# Patient Record
Sex: Female | Born: 1975 | Race: White | Hispanic: No | Marital: Married
Health system: Southern US, Community
[De-identification: ages and names within clinical notes are randomized; demographics above are authoritative.]

## PROBLEM LIST (undated history)

## (undated) DIAGNOSIS — Z803 Family history of malignant neoplasm of breast: Secondary | ICD-10-CM

## (undated) DIAGNOSIS — K219 Gastro-esophageal reflux disease without esophagitis: Secondary | ICD-10-CM

## (undated) DIAGNOSIS — I499 Cardiac arrhythmia, unspecified: Secondary | ICD-10-CM

## (undated) DIAGNOSIS — J45909 Unspecified asthma, uncomplicated: Secondary | ICD-10-CM

## (undated) DIAGNOSIS — F419 Anxiety disorder, unspecified: Secondary | ICD-10-CM

## (undated) DIAGNOSIS — Z8659 Personal history of other mental and behavioral disorders: Secondary | ICD-10-CM

## (undated) DIAGNOSIS — Z9889 Other specified postprocedural states: Secondary | ICD-10-CM

## (undated) DIAGNOSIS — Z8041 Family history of malignant neoplasm of ovary: Secondary | ICD-10-CM

## (undated) DIAGNOSIS — E119 Type 2 diabetes mellitus without complications: Secondary | ICD-10-CM

## (undated) DIAGNOSIS — Z808 Family history of malignant neoplasm of other organs or systems: Secondary | ICD-10-CM

## (undated) DIAGNOSIS — Z87898 Personal history of other specified conditions: Secondary | ICD-10-CM

## (undated) HISTORY — DX: Family history of malignant neoplasm of other organs or systems: Z80.8

## (undated) HISTORY — DX: Personal history of other mental and behavioral disorders: Z86.59

## (undated) HISTORY — PX: WISDOM TOOTH EXTRACTION: SHX21

## (undated) HISTORY — DX: Family history of malignant neoplasm of ovary: Z80.41

## (undated) HISTORY — DX: Personal history of other specified conditions: Z87.898

## (undated) HISTORY — PX: OTHER SURGICAL HISTORY: SHX169

## (undated) HISTORY — DX: Family history of malignant neoplasm of breast: Z80.3

---
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
## 2017-03-13 ENCOUNTER — Encounter: Payer: Self-pay | Admitting: Cardiology

## 2017-03-13 NOTE — Progress Notes (Signed)
Cardiology Office Note  Date: 03/14/2017   ID: Jennifer Kane, DOB 1975/11/10, MRN [id number]  PCP: Rory Percy, MD  Consulting Cardiologist: Rozann Lesches, MD   Chief Complaint  Patient presents with  . Intermittent lightheadedness    History of Present Illness: Jennifer Kane is a 41 y.o. female referred for cardiology consultation by Dr. Nadara Mustard for evaluation of orthostatic hypotension.  She reports episodes of feeling weak and lightheaded intermittently, particularly when she bends over and stands back up.  This has not necessarily been associated with palpitations, shortness of breath, or chest pain.  He has not had any frank syncope.  Details are not clear but she was diagnosed with tachycardia at age 48, reportedly had an abnormal tilt table test at [facility] and was placed on beta-blocker which she has been on ever since.  She was noted to be orthostatic at recent check with PCP and taken off atenolol completely.  She was only taking a half of a 12.5 mg dose.  She exercises with [employer], generally feels well but does not overly push herself.  States that she hydrates well, drinks water throughout the day.  States that she has a history of panic attacks, but none in many years.  Orthostatics were checked in the office today.  Supine blood pressure 112/78 with heart rate 62, seated blood pressure 106/74 with heart rate 67, standing blood pressure 122/80 with heart rate 75, and standing blood pressure after 3 minutes 118/78 with heart rate 72.  Past Medical History:  Diagnosis Date  . History of panic attacks   . History of tachycardia    Details not clear, reported abnormal tilt table test at age 38, on chronic low-dose beta-blocker    Past Surgical History:  Procedure Laterality Date  . No previous surgeries      Current Outpatient Prescriptions  Medication Sig Dispense Refill  . levonorgestrel-ethinyl estradiol (PORTIA-28) 0.15-30 MG-MCG tablet Take 1 tablet by mouth  daily.      No current facility-administered medications for this visit.    Allergies:  Patient has no known allergies.   Social History: The patient  reports that she has never smoked. She has never used smokeless tobacco. She reports that she does not drink alcohol or use drugs.   Family History: The patient's family history includes CAD in her father.   ROS:  Please see the history of present illness. Otherwise, complete review of systems is positive for none.  All other systems are reviewed and negative.   Physical Exam: VS:  BP 114/80   Pulse 84   Ht 5\' 3"  (1.6 m)   Wt 210 lb (95.3 kg)   SpO2 98%   BMI 37.20 kg/m , BMI Body mass index is 37.2 kg/m.  Wt Readings from Last 3 Encounters:  03/14/17 210 lb (95.3 kg)    General: Obese woman, appears comfortable at rest. HEENT: Conjunctiva and lids normal, oropharynx clear. Neck: Supple, no elevated JVP or carotid bruits, no thyromegaly. Lungs: Clear to auscultation, nonlabored breathing at rest. Cardiac: Regular rate and rhythm, no S3 or significant systolic murmur, no pericardial rub. Abdomen: Soft, nontender, bowel sounds present, no guarding or rebound. Extremities: No pitting edema, distal pulses 2+. Skin: Warm and dry. Musculoskeletal: No kyphosis. Neuropsychiatric: Alert and oriented x3, affect grossly appropriate.  ECG: I personally reviewed the tracing from 02/08/2017 which showed sinus bradycardia with low voltage.  Recent Labwork:  April 2018: BUN 14, creatinine 0.84, potassium 4.6, AST 16, ALT 18, hemoglobin  13.5, cholesterol 167, triglycerides 165, HDL 35, LDL 99, hemoglobin A1c 5.8, TSH 2.65  Assessment and Plan:  1.  Intermittent orthostatic lightheadedness, no frank syncope or definitive palpitations.  Her history suggests potential orthostatic intolerance in the past with reported abnormal tilt table test.  Current orthostatic measurements are normal and argue against POTS syndrome as well.  For now would  stay off atenolol.  We will obtain an echocardiogram to ensure normal cardiac structure and function, and also a 7-day event monitor to exclude any potential arrhythmias in association with symptoms.  Would otherwise continue to exercise and maintain an adequate hydration status.  2.  History of panic attacks, none reported in several years.  Current medicines were reviewed with the patient today.   Orders Placed This Encounter  Procedures  . Cardiac event monitor  . ECHOCARDIOGRAM COMPLETE    Disposition: Call with test results.  Signed, Satira Sark, MD, [facility] 03/14/2017 3:38 PM    [facility] [address], [place],  [address] Phone: [phone]; Fax: [phone]

## 2017-03-14 ENCOUNTER — Ambulatory Visit (INDEPENDENT_AMBULATORY_CARE_PROVIDER_SITE_OTHER): Payer: Commercial Managed Care - PPO | Admitting: Cardiology

## 2017-03-14 ENCOUNTER — Encounter: Payer: Self-pay | Admitting: Cardiology

## 2017-03-14 VITALS — BP 114/80 | HR 84 | Ht 63.0 in | Wt 210.0 lb

## 2017-03-14 DIAGNOSIS — R55 Syncope and collapse: Secondary | ICD-10-CM | POA: Diagnosis not present

## 2017-03-14 DIAGNOSIS — R42 Dizziness and giddiness: Secondary | ICD-10-CM | POA: Diagnosis not present

## 2017-03-14 DIAGNOSIS — Z8659 Personal history of other mental and behavioral disorders: Secondary | ICD-10-CM

## 2017-03-14 DIAGNOSIS — I951 Orthostatic hypotension: Secondary | ICD-10-CM | POA: Diagnosis not present

## 2017-03-14 NOTE — Patient Instructions (Signed)
Medication Instructions:  Your physician recommends that you continue on your current medications as directed. Please refer to the Current Medication list given to you today.  Labwork: NONE  Testing/Procedures: Your physician has requested that you have an echocardiogram. Echocardiography is a painless test that uses sound waves to create images of your heart. It provides your doctor with information about the size and shape of your heart and how well your heart's chambers and valves are working. This procedure takes approximately one hour. There are no restrictions for this procedure.  Your physician has recommended that you wear an event monitor FOR 7 DAYS. Event monitors are medical devices that record the heart's electrical activity. Doctors most often [place] these monitors to diagnose arrhythmias. Arrhythmias are problems with the speed or rhythm of the heartbeat. The monitor is a small, portable device. You can wear one while you do your normal daily activities. This is usually used to diagnose what is causing palpitations/syncope (passing out).   Follow-Up: Your physician recommends that you schedule a follow-up appointment PENDING TEST RESULTS  Any Other Special Instructions Will Be Listed Below (If Applicable).  If you need a refill on your cardiac medications before your next appointment, please call your pharmacy.

## 2017-03-21 ENCOUNTER — Encounter (INDEPENDENT_AMBULATORY_CARE_PROVIDER_SITE_OTHER): Payer: Commercial Managed Care - PPO

## 2017-03-21 ENCOUNTER — Telehealth: Payer: Self-pay

## 2017-03-21 ENCOUNTER — Ambulatory Visit (INDEPENDENT_AMBULATORY_CARE_PROVIDER_SITE_OTHER): Payer: Commercial Managed Care - PPO

## 2017-03-21 ENCOUNTER — Other Ambulatory Visit: Payer: Self-pay

## 2017-03-21 DIAGNOSIS — R42 Dizziness and giddiness: Secondary | ICD-10-CM

## 2017-03-21 DIAGNOSIS — R55 Syncope and collapse: Secondary | ICD-10-CM

## 2017-03-21 NOTE — Telephone Encounter (Signed)
-----   Message from Satira Sark, MD sent at 03/21/2017 12:59 PM EST ----- Results reviewed.  Normal LVEF at 60-65% and no major valvular abnormalities.  Overall reassuring.  We will follow-up on cardiac monitor. A copy of this test should be forwarded to Rory Percy, MD.

## 2017-03-21 NOTE — Telephone Encounter (Signed)
Patient notified. Routed to PCP 

## 2017-04-01 ENCOUNTER — Telehealth: Payer: Self-pay

## 2017-04-01 NOTE — Telephone Encounter (Signed)
LMTCB

## 2017-04-01 NOTE — Telephone Encounter (Signed)
-----   Message from Acquanetta Chain, LPN sent at 04/88/8916 10:30 AM EST -----   ----- Message ----- From: Satira Sark, MD Sent: 04/01/2017  10:25 AM To: Merlene Laughter, LPN  Results reviewed.  Reassuring results, no arrhythmia noted. A copy of this test should be forwarded to Rory Percy, MD.

## 2017-04-02 NOTE — Telephone Encounter (Signed)
LMTCB

## 2017-04-02 NOTE — Telephone Encounter (Signed)
Patient notified. Routed to PCP 

## 2018-03-20 DIAGNOSIS — Z6833 Body mass index (BMI) 33.0-33.9, adult: Secondary | ICD-10-CM | POA: Diagnosis not present

## 2018-03-20 DIAGNOSIS — R1013 Epigastric pain: Secondary | ICD-10-CM | POA: Diagnosis not present

## 2018-03-25 DIAGNOSIS — K8020 Calculus of gallbladder without cholecystitis without obstruction: Secondary | ICD-10-CM | POA: Diagnosis not present

## 2018-03-25 DIAGNOSIS — R160 Hepatomegaly, not elsewhere classified: Secondary | ICD-10-CM | POA: Diagnosis not present

## 2018-03-25 DIAGNOSIS — R1013 Epigastric pain: Secondary | ICD-10-CM | POA: Diagnosis not present

## 2018-04-14 DIAGNOSIS — K8010 Calculus of gallbladder with chronic cholecystitis without obstruction: Secondary | ICD-10-CM | POA: Diagnosis not present

## 2018-04-15 ENCOUNTER — Other Ambulatory Visit: Payer: Self-pay | Admitting: Family Medicine

## 2018-04-15 DIAGNOSIS — R160 Hepatomegaly, not elsewhere classified: Secondary | ICD-10-CM

## 2018-04-25 NOTE — Progress Notes (Signed)
03-21-17 (Epic) ECHO

## 2018-04-25 NOTE — Patient Instructions (Addendum)
Jennifer Kane  04/25/2018   Your procedure is scheduled on: 05-01-18    Report to [facility] Main  Entrance    Report to Admitting at 8:00 AM    Call this number if you have problems the morning of surgery [phone]    Remember: Do not eat food or drink liquids :After Midnight.    BRUSH YOUR TEETH MORNING OF SURGERY AND RINSE YOUR MOUTH OUT, NO CHEWING GUM CANDY OR MINTS.     Take these medicines the morning of surgery with A SIP OF WATER: None                                You may not have any metal on your body including hair pins and              piercings  Do not wear jewelry, make-up, lotions, powders or perfumes, deodorant             Do not wear nail polish.  Do not shave  48 hours prior to surgery.     Do not bring valuables to the hospital. [place].  Contacts, dentures or bridgework may not be worn into surgery.      Patients discharged the day of surgery will not be allowed to drive home.  Name and phone number of your driver: Georgiann Neider [phone]  Special Instructions: N/A              Please read over the following fact sheets you were given: _____________________________________________________________________             [facility] - Preparing for Surgery Before surgery, you can play an important role.  Because skin is not sterile, your skin needs to be as free of germs as possible.  You can reduce the number of germs on your skin by washing with CHG (chlorahexidine gluconate) soap before surgery.  CHG is an antiseptic cleaner which kills germs and bonds with the skin to continue killing germs even after washing. Please DO NOT use if you have an allergy to CHG or antibacterial soaps.  If your skin becomes reddened/irritated stop using the CHG and inform your nurse when you arrive at Short Stay. Do not shave (including legs and underarms) for at least 48 hours prior to  the first CHG shower.  You may shave your face/neck. Please follow these instructions carefully:  1.  Shower with CHG Soap the night before surgery and the  morning of Surgery.  2.  If you choose to wash your hair, wash your hair first as usual with your  normal  shampoo.  3.  After you shampoo, rinse your hair and body thoroughly to remove the  shampoo.                           4.  Use CHG as you would any other liquid soap.  You can apply chg directly  to the skin and wash                       Gently with a scrungie or clean washcloth.  5.  Apply the CHG Soap to your body  ONLY FROM THE NECK DOWN.   Do not use on face/ open                           Wound or open sores. Avoid contact with eyes, ears mouth and genitals (private parts).                       Wash face,  Genitals (private parts) with your normal soap.             6.  Wash thoroughly, paying special attention to the area where your surgery  will be performed.  7.  Thoroughly rinse your body with warm water from the neck down.  8.  DO NOT shower/wash with your normal soap after using and rinsing off  the CHG Soap.                9.  Pat yourself dry with a clean towel.            10.  Wear clean pajamas.            11.  Place clean sheets on your bed the night of your first shower and do not  sleep with pets. Day of Surgery : Do not apply any lotions/deodorants the morning of surgery.  Please wear clean clothes to the hospital/surgery center.  FAILURE TO FOLLOW THESE INSTRUCTIONS MAY RESULT IN THE CANCELLATION OF YOUR SURGERY PATIENT SIGNATURE_________________________________  NURSE SIGNATURE__________________________________  ________________________________________________________________________

## 2018-04-26 ENCOUNTER — Ambulatory Visit
Admission: RE | Admit: 2018-04-26 | Discharge: 2018-04-26 | Disposition: A | Discharge status: Home / Self Care | Payer: Self-pay | Source: Ambulatory Visit | Attending: Family Medicine | Admitting: Family Medicine

## 2018-04-26 DIAGNOSIS — D1801 Hemangioma of skin and subcutaneous tissue: Secondary | ICD-10-CM | POA: Diagnosis not present

## 2018-04-26 DIAGNOSIS — R160 Hepatomegaly, not elsewhere classified: Secondary | ICD-10-CM

## 2018-04-26 MED ORDER — GADOBENATE DIMEGLUMINE 529 MG/ML IV SOLN
19.0000 mL | Freq: Once | INTRAVENOUS | Status: AC | PRN
  Administered 2018-04-26: 19 mL via INTRAVENOUS

## 2018-04-28 ENCOUNTER — Encounter (HOSPITAL_COMMUNITY): Payer: Self-pay | Admitting: *Deleted

## 2018-04-28 ENCOUNTER — Encounter (HOSPITAL_COMMUNITY)
Admission: RE | Admit: 2018-04-28 | Discharge: 2018-04-28 | Disposition: A | Discharge status: Home / Self Care | Payer: BC Managed Care – PPO | Source: Ambulatory Visit | Attending: General Surgery | Admitting: General Surgery

## 2018-04-28 ENCOUNTER — Other Ambulatory Visit: Payer: Self-pay

## 2018-04-28 DIAGNOSIS — R001 Bradycardia, unspecified: Secondary | ICD-10-CM | POA: Diagnosis not present

## 2018-04-28 DIAGNOSIS — Z01818 Encounter for other preprocedural examination: Secondary | ICD-10-CM | POA: Diagnosis not present

## 2018-04-28 HISTORY — DX: Gastro-esophageal reflux disease without esophagitis: K21.9

## 2018-04-28 HISTORY — DX: Unspecified asthma, uncomplicated: J45.909

## 2018-04-28 HISTORY — DX: Cardiac arrhythmia, unspecified: I49.9

## 2018-04-28 HISTORY — DX: Anxiety disorder, unspecified: F41.9

## 2018-04-28 LAB — CBC
HCT: 41.1 % (ref 36.0–46.0)
Hemoglobin: 13.1 g/dL (ref 12.0–15.0)
MCH: 30.6 pg (ref 26.0–34.0)
MCHC: 31.9 g/dL (ref 30.0–36.0)
MCV: 96 fL (ref 80.0–100.0)
Platelets: 310 10*3/uL (ref 150–400)
RBC: 4.28 MIL/uL (ref 3.87–5.11)
RDW: 12.6 % (ref 11.5–15.5)
WBC: 5.7 10*3/uL (ref 4.0–10.5)
nRBC: 0 % (ref 0.0–0.2)

## 2018-04-28 LAB — BASIC METABOLIC PANEL
Anion gap: 7 (ref 5–15)
BUN: 10 mg/dL (ref 6–20)
CO2: 25 mmol/L (ref 22–32)
Calcium: 8.9 mg/dL (ref 8.9–10.3)
Chloride: 107 mmol/L (ref 98–111)
Creatinine, Ser: 0.83 mg/dL (ref 0.44–1.00)
GFR calc Af Amer: >60 mL/min (ref >60)
GFR calc non Af Amer: >60 mL/min (ref >60)
Glucose, Bld: 99 mg/dL (ref 70–99)
Potassium: 4.1 mmol/L (ref 3.5–5.1)
SODIUM: 139 mmol/L (ref 135–145)

## 2018-04-28 LAB — PREGNANCY, URINE: Preg Test, Ur: NEGATIVE

## 2018-05-01 ENCOUNTER — Ambulatory Visit (HOSPITAL_COMMUNITY): Payer: BC Managed Care – PPO | Admitting: Anesthesiology

## 2018-05-01 ENCOUNTER — Encounter (HOSPITAL_COMMUNITY): Payer: Self-pay | Admitting: Emergency Medicine

## 2018-05-01 ENCOUNTER — Encounter (HOSPITAL_COMMUNITY)
Admission: RE | Disposition: A | Discharge status: Home / Self Care | Payer: Self-pay | Source: Other Acute Inpatient Hospital | Attending: General Surgery

## 2018-05-01 ENCOUNTER — Ambulatory Visit (HOSPITAL_COMMUNITY)
Admission: RE | Admit: 2018-05-01 | Discharge: 2018-05-01 | Disposition: A | Discharge status: Home / Self Care | Payer: BC Managed Care – PPO | Source: Other Acute Inpatient Hospital | Attending: General Surgery | Admitting: General Surgery

## 2018-05-01 DIAGNOSIS — K219 Gastro-esophageal reflux disease without esophagitis: Secondary | ICD-10-CM | POA: Insufficient documentation

## 2018-05-01 DIAGNOSIS — J45909 Unspecified asthma, uncomplicated: Secondary | ICD-10-CM | POA: Diagnosis not present

## 2018-05-01 DIAGNOSIS — K8010 Calculus of gallbladder with chronic cholecystitis without obstruction: Secondary | ICD-10-CM | POA: Insufficient documentation

## 2018-05-01 DIAGNOSIS — Z8249 Family history of ischemic heart disease and other diseases of the circulatory system: Secondary | ICD-10-CM | POA: Insufficient documentation

## 2018-05-01 DIAGNOSIS — Z91018 Allergy to other foods: Secondary | ICD-10-CM | POA: Insufficient documentation

## 2018-05-01 DIAGNOSIS — F419 Anxiety disorder, unspecified: Secondary | ICD-10-CM | POA: Diagnosis not present

## 2018-05-01 DIAGNOSIS — Z79899 Other long term (current) drug therapy: Secondary | ICD-10-CM | POA: Insufficient documentation

## 2018-05-01 HISTORY — PX: CHOLECYSTECTOMY: SHX55

## 2018-05-01 SURGERY — LAPAROSCOPIC CHOLECYSTECTOMY
Anesthesia: General

## 2018-05-01 MED ORDER — BUPIVACAINE-EPINEPHRINE (PF) 0.25% -1:200000 IJ SOLN
INTRAMUSCULAR | Status: AC
  Filled 2018-05-01: qty 30

## 2018-05-01 MED ORDER — BUPIVACAINE-EPINEPHRINE 0.25% -1:200000 IJ SOLN
INTRAMUSCULAR | Status: DC | PRN
  Administered 2018-05-01: 30 mL

## 2018-05-01 MED ORDER — HYDROCODONE-ACETAMINOPHEN 5-325 MG PO TABS: 1.0000 | ORAL_TABLET | Freq: Four times a day (QID) | ORAL | 0 refills | Status: DC | PRN

## 2018-05-01 MED ORDER — ONDANSETRON HCL 4 MG/2ML IJ SOLN
INTRAMUSCULAR | Status: DC | PRN
  Administered 2018-05-01: 4 mg via INTRAVENOUS

## 2018-05-01 MED ORDER — SUGAMMADEX SODIUM 200 MG/2ML IV SOLN
INTRAVENOUS | Status: DC | PRN
  Administered 2018-05-01: 200 mg via INTRAVENOUS

## 2018-05-01 MED ORDER — EPHEDRINE SULFATE-NACL 50-0.9 MG/10ML-% IV SOSY
PREFILLED_SYRINGE | INTRAVENOUS | Status: DC | PRN
  Administered 2018-05-01: 10 mg via INTRAVENOUS

## 2018-05-01 MED ORDER — ROCURONIUM BROMIDE 100 MG/10ML IV SOLN
INTRAVENOUS | Status: DC | PRN
  Administered 2018-05-01: 50 mg via INTRAVENOUS

## 2018-05-01 MED ORDER — PHENYLEPHRINE 40 MCG/ML (10ML) SYRINGE FOR IV PUSH (FOR BLOOD PRESSURE SUPPORT)
PREFILLED_SYRINGE | INTRAVENOUS | Status: AC
  Filled 2018-05-01: qty 10

## 2018-05-01 MED ORDER — ONDANSETRON HCL 4 MG/2ML IJ SOLN
INTRAMUSCULAR | Status: AC
  Filled 2018-05-01: qty 2

## 2018-05-01 MED ORDER — SUGAMMADEX SODIUM 200 MG/2ML IV SOLN
INTRAVENOUS | Status: AC
  Filled 2018-05-01: qty 2

## 2018-05-01 MED ORDER — DEXAMETHASONE SODIUM PHOSPHATE 10 MG/ML IJ SOLN
INTRAMUSCULAR | Status: DC | PRN
  Administered 2018-05-01: 8 mg via INTRAVENOUS

## 2018-05-01 MED ORDER — PROPOFOL 10 MG/ML IV BOLUS
INTRAVENOUS | Status: AC
  Filled 2018-05-01: qty 20

## 2018-05-01 MED ORDER — LACTATED RINGERS IV BOLUS: 500.0000 mL | Freq: Once | INTRAVENOUS | Status: DC

## 2018-05-01 MED ORDER — MIDAZOLAM HCL 2 MG/2ML IJ SOLN
INTRAMUSCULAR | Status: AC
  Filled 2018-05-01: qty 2

## 2018-05-01 MED ORDER — IBUPROFEN 800 MG PO TABS: 800.0000 mg | ORAL_TABLET | Freq: Three times a day (TID) | ORAL | 0 refills | Status: DC | PRN

## 2018-05-01 MED ORDER — CHLORHEXIDINE GLUCONATE CLOTH 2 % EX PADS: 6.0000 | MEDICATED_PAD | Freq: Once | CUTANEOUS | Status: DC

## 2018-05-01 MED ORDER — PROPOFOL 10 MG/ML IV BOLUS
INTRAVENOUS | Status: DC | PRN
  Administered 2018-05-01: 150 mg via INTRAVENOUS

## 2018-05-01 MED ORDER — HYDROMORPHONE HCL 1 MG/ML IJ SOLN
INTRAMUSCULAR | Status: AC
  Filled 2018-05-01: qty 1

## 2018-05-01 MED ORDER — PHENYLEPHRINE 40 MCG/ML (10ML) SYRINGE FOR IV PUSH (FOR BLOOD PRESSURE SUPPORT)
PREFILLED_SYRINGE | INTRAVENOUS | Status: DC | PRN
  Administered 2018-05-01: 120 ug via INTRAVENOUS
  Administered 2018-05-01: 80 ug via INTRAVENOUS

## 2018-05-01 MED ORDER — METOCLOPRAMIDE HCL 5 MG/ML IJ SOLN
5.0000 mg | Freq: Once | INTRAMUSCULAR | Status: AC
  Administered 2018-05-01: 5 mg via INTRAVENOUS

## 2018-05-01 MED ORDER — METOCLOPRAMIDE HCL 5 MG/ML IJ SOLN
INTRAMUSCULAR | Status: AC
  Filled 2018-05-01: qty 2

## 2018-05-01 MED ORDER — LIDOCAINE HCL (CARDIAC) PF 100 MG/5ML IV SOSY
PREFILLED_SYRINGE | INTRAVENOUS | Status: DC | PRN
  Administered 2018-05-01: 100 mg via INTRAVENOUS

## 2018-05-01 MED ORDER — 0.9 % SODIUM CHLORIDE (POUR BTL) OPTIME
TOPICAL | Status: DC | PRN
  Administered 2018-05-01: 1000 mL

## 2018-05-01 MED ORDER — MIDAZOLAM HCL 5 MG/5ML IJ SOLN
INTRAMUSCULAR | Status: DC | PRN
  Administered 2018-05-01: 2 mg via INTRAVENOUS

## 2018-05-01 MED ORDER — FENTANYL CITRATE (PF) 100 MCG/2ML IJ SOLN
INTRAMUSCULAR | Status: DC | PRN
  Administered 2018-05-01 (×2): 50 ug via INTRAVENOUS
  Administered 2018-05-01: 100 ug via INTRAVENOUS

## 2018-05-01 MED ORDER — EPHEDRINE 5 MG/ML INJ
INTRAVENOUS | Status: AC
  Filled 2018-05-01: qty 10

## 2018-05-01 MED ORDER — LACTATED RINGERS IR SOLN
Status: DC | PRN
  Administered 2018-05-01: 1000 mL

## 2018-05-01 MED ORDER — KETOROLAC TROMETHAMINE 30 MG/ML IJ SOLN
INTRAMUSCULAR | Status: AC
  Filled 2018-05-01: qty 1

## 2018-05-01 MED ORDER — KETOROLAC TROMETHAMINE 30 MG/ML IJ SOLN
30.0000 mg | Freq: Once | INTRAMUSCULAR | Status: AC
  Administered 2018-05-01: 30 mg via INTRAVENOUS

## 2018-05-01 MED ORDER — LACTATED RINGERS IV SOLN
INTRAVENOUS | Status: DC
  Administered 2018-05-01: 09:00:00 via INTRAVENOUS

## 2018-05-01 MED ORDER — ROCURONIUM BROMIDE 10 MG/ML (PF) SYRINGE
PREFILLED_SYRINGE | INTRAVENOUS | Status: AC
  Filled 2018-05-01: qty 10

## 2018-05-01 MED ORDER — FENTANYL CITRATE (PF) 250 MCG/5ML IJ SOLN
INTRAMUSCULAR | Status: AC
  Filled 2018-05-01: qty 5

## 2018-05-01 MED ORDER — SODIUM CHLORIDE 0.9 % IV SOLN
2.0000 g | INTRAVENOUS | Status: AC
  Administered 2018-05-01: 2 g via INTRAVENOUS
  Filled 2018-05-01: qty 2

## 2018-05-01 MED ORDER — DEXAMETHASONE SODIUM PHOSPHATE 10 MG/ML IJ SOLN
INTRAMUSCULAR | Status: AC
  Filled 2018-05-01: qty 1

## 2018-05-01 MED ORDER — HYDROMORPHONE HCL 1 MG/ML IJ SOLN
0.2500 mg | INTRAMUSCULAR | Status: DC | PRN
  Administered 2018-05-01 (×2): 0.5 mg via INTRAVENOUS

## 2018-05-01 MED ORDER — LIDOCAINE 2% (20 MG/ML) 5 ML SYRINGE
INTRAMUSCULAR | Status: AC
  Filled 2018-05-01: qty 5

## 2018-05-01 SURGICAL SUPPLY — 44 items
APPLIER CLIP ROT 10 11.4 M/L (STAPLE)
BANDAGE ADH SHEER 1  50/CT (GAUZE/BANDAGES/DRESSINGS) IMPLANT
BENZOIN TINCTURE PRP APPL 2/3 (GAUZE/BANDAGES/DRESSINGS) IMPLANT
CABLE HIGH FREQUENCY MONO STRZ (ELECTRODE) ×3 IMPLANT
CATH CHOLANG 76X19 KUMAR (CATHETERS) IMPLANT
CHLORAPREP W/TINT 26ML (MISCELLANEOUS) ×3 IMPLANT
CLIP APPLIE ROT 10 11.4 M/L (STAPLE) IMPLANT
CLIP VESOLOCK LG 6/CT PURPLE (CLIP) IMPLANT
CLIP VESOLOCK MED LG 6/CT (CLIP) ×6 IMPLANT
CLOSURE WOUND 1/2 X4 (GAUZE/BANDAGES/DRESSINGS)
COVER MAYO STAND STRL (DRAPES) IMPLANT
COVER SURGICAL LIGHT HANDLE (MISCELLANEOUS) ×3 IMPLANT
COVER WAND RF STERILE (DRAPES) ×3 IMPLANT
DERMABOND ADVANCED (GAUZE/BANDAGES/DRESSINGS) ×2
DERMABOND ADVANCED .7 DNX12 (GAUZE/BANDAGES/DRESSINGS) ×1 IMPLANT
DRAIN CHANNEL 19F RND (DRAIN) IMPLANT
DRAPE C-ARM 42X120 X-RAY (DRAPES) IMPLANT
EVACUATOR SILICONE 100CC (DRAIN) IMPLANT
GLOVE BIOGEL PI IND STRL 6.5 (GLOVE) ×1 IMPLANT
GLOVE BIOGEL PI IND STRL 7.0 (GLOVE) ×3 IMPLANT
GLOVE BIOGEL PI INDICATOR 6.5 (GLOVE) ×2
GLOVE BIOGEL PI INDICATOR 7.0 (GLOVE) ×6
GLOVE ECLIPSE 6.5 STRL STRAW (GLOVE) ×3 IMPLANT
GLOVE SURG SS PI 7.0 STRL IVOR (GLOVE) ×3 IMPLANT
GOWN STRL REUS W/TWL LRG LVL3 (GOWN DISPOSABLE) ×6 IMPLANT
GOWN STRL REUS W/TWL XL LVL3 (GOWN DISPOSABLE) ×3 IMPLANT
GRASPER SUT TROCAR 14GX15 (MISCELLANEOUS) IMPLANT
KIT BASIN OR (CUSTOM PROCEDURE TRAY) ×3 IMPLANT
POUCH RETRIEVAL ECOSAC 10 (ENDOMECHANICALS) ×1 IMPLANT
POUCH RETRIEVAL ECOSAC 10MM (ENDOMECHANICALS) ×2
SCISSORS LAP 5X35 DISP (ENDOMECHANICALS) ×3 IMPLANT
SET IRRIG TUBING LAPAROSCOPIC (IRRIGATION / IRRIGATOR) ×3 IMPLANT
SLEEVE XCEL OPT CAN 5 100 (ENDOMECHANICALS) ×6 IMPLANT
STOPCOCK 4 WAY LG BORE MALE ST (IV SETS) IMPLANT
STRIP CLOSURE SKIN 1/2X4 (GAUZE/BANDAGES/DRESSINGS) IMPLANT
SUT ETHILON 2 0 PS N (SUTURE) IMPLANT
SUT MNCRL AB 4-0 PS2 18 (SUTURE) ×3 IMPLANT
SUT VICRYL 0 ENDOLOOP (SUTURE) IMPLANT
TOWEL OR 17X26 10 PK STRL BLUE (TOWEL DISPOSABLE) ×3 IMPLANT
TOWEL OR NON WOVEN STRL DISP B (DISPOSABLE) IMPLANT
TRAY LAPAROSCOPIC (CUSTOM PROCEDURE TRAY) ×3 IMPLANT
TROCAR BLADELESS OPT 5 100 (ENDOMECHANICALS) ×3 IMPLANT
TROCAR XCEL NON-BLD 11X100MML (ENDOMECHANICALS) ×3 IMPLANT
TUBING INSUF HEATED (TUBING) ×3 IMPLANT

## 2018-05-01 NOTE — Transfer of Care (Signed)
Immediate Anesthesia Transfer of Care Note  Patient: Jennifer Kane  Procedure(s) Performed: LAPAROSCOPIC CHOLECYSTECTOMY (N/A )  Patient Location: PACU  Anesthesia Type:General  Level of Consciousness: awake, alert  and oriented  Airway & Oxygen Therapy: Patient Spontanous Breathing and Patient connected to face mask oxygen  Post-op Assessment: Report given to RN and Post -op Vital signs reviewed and stable  Post vital signs: Reviewed and stable  Last Vitals:  Vitals Value Taken Time  BP 125/80 05/01/2018 10:07 AM  Temp    Pulse 72 05/01/2018 10:09 AM  Resp 16 05/01/2018 10:09 AM  SpO2 98 % 05/01/2018 10:09 AM  Vitals shown include unvalidated device data.  Last Pain:  Vitals:   05/01/18 0829  TempSrc:   PainSc: 0-No pain      Patients Stated Pain Goal: 4 (15/17/61 6073)  Complications: No apparent anesthesia complications

## 2018-05-01 NOTE — Anesthesia Postprocedure Evaluation (Signed)
Anesthesia Post Note  Patient: Jennifer Kane  Procedure(s) Performed: LAPAROSCOPIC CHOLECYSTECTOMY (N/A )     Patient location during evaluation: PACU Anesthesia Type: General Level of consciousness: awake and alert Pain management: pain level controlled Vital Signs Assessment: post-procedure vital signs reviewed and stable Respiratory status: spontaneous breathing, nonlabored ventilation and respiratory function stable Cardiovascular status: blood pressure returned to baseline and stable Postop Assessment: no apparent nausea or vomiting Anesthetic complications: no    Last Vitals:  Vitals:   05/01/18 1045 05/01/18 1100  BP: 116/70 105/70  Pulse: 60 63  Resp: 14 12  Temp:  36.7 C  SpO2: 90% 92%    Last Pain:  Vitals:   05/01/18 1105  TempSrc:   PainSc: Asleep                 Anora Schwenke,W. EDMOND

## 2018-05-01 NOTE — Anesthesia Procedure Notes (Signed)
Procedure Name: Intubation Date/Time: 05/01/2018 9:13 AM Performed by: Glory Buff, CRNA Pre-anesthesia Checklist: Patient identified, Emergency Drugs available, Suction available and Patient being monitored Patient Re-evaluated:Patient Re-evaluated prior to induction Oxygen Delivery Method: Circle system utilized Preoxygenation: Pre-oxygenation with 100% oxygen Induction Type: IV induction Ventilation: Mask ventilation without difficulty Laryngoscope Size: Miller and 3 Grade View: Grade I Tube type: Oral Tube size: 7.0 mm Number of attempts: 1 Airway Equipment and Method: Stylet and Oral airway Placement Confirmation: ETT inserted through vocal cords under direct vision,  positive ETCO2 and breath sounds checked- equal and bilateral Secured at: 21 cm Tube secured with: Tape Dental Injury: Teeth and Oropharynx as per pre-operative assessment

## 2018-05-01 NOTE — Anesthesia Preprocedure Evaluation (Addendum)
Anesthesia Evaluation  Patient identified by MRN, date of birth, ID band Patient awake    Reviewed: Allergy & Precautions, H&P , NPO status , Patient's Chart, lab work & pertinent test results  Airway Mallampati: II  TM Distance: >3 FB Neck ROM: Full    Dental no notable dental hx. (+) Teeth Intact, Dental Advisory Given   Pulmonary asthma ,    Pulmonary exam normal breath sounds clear to auscultation       Cardiovascular negative cardio ROS   Rhythm:Regular Rate:Normal     Neuro/Psych Anxiety negative neurological ROS  negative psych ROS   GI/Hepatic negative GI ROS, Neg liver ROS, GERD  Controlled,  Endo/Other  negative endocrine ROS  Renal/GU negative Renal ROS  negative genitourinary   Musculoskeletal   Abdominal   Peds  Hematology negative hematology ROS (+)   Anesthesia Other Findings   Reproductive/Obstetrics negative OB ROS                            Anesthesia Physical Anesthesia Plan  ASA: II  Anesthesia Plan: General   Post-op Pain Management:    Induction: Intravenous  PONV Risk Score and Plan: 4 or greater and Ondansetron, Dexamethasone and Midazolam  Airway Management Planned: Oral ETT  Additional Equipment:   Intra-op Plan:   Post-operative Plan: Extubation in OR  Informed Consent: I have reviewed the patients History and Physical, chart, labs and discussed the procedure including the risks, benefits and alternatives for the proposed anesthesia with the patient or authorized representative who has indicated his/her understanding and acceptance.   Dental advisory given  Plan Discussed with: CRNA  Anesthesia Plan Comments:         Anesthesia Quick Evaluation

## 2018-05-01 NOTE — H&P (Signed)
Jennifer Kane is an 42 y.o. female.   Chief Complaint: abdominal pain HPI: 42 yo female with epigastric abdominal pain, nausea and findings consistent with gallbladder disease.  Past Medical History:  Diagnosis Date  . Anxiety   . Asthma   . Dysrhythmia    Hx of Irregular Heartbeat  . GERD (gastroesophageal reflux disease)   . History of panic attacks   . History of tachycardia    Details not clear, reported abnormal tilt table test at age 81, on chronic low-dose beta-blocker    Past Surgical History:  Procedure Laterality Date  . KNEE ARTHROSCOPY  1989  . No previous surgeries    . WISDOM TOOTH EXTRACTION      Family History  Problem Relation Age of Onset  . CAD Father    Social History:  reports that she has never smoked. She has never used smokeless tobacco. She reports that she does not drink alcohol or use drugs.  Allergies:  Allergies  Allergen Reactions  . 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 EPINEPHrine (EPIPEN 2-PAK) 0.3 mg/0.3 mL IJ SOAJ injection Inject 0.3 mg into the muscle once.      No results found for this or any previous visit (from the past 48 hour(s)). No results found.  Review of Systems  Constitutional: Negative for chills and fever.  HENT: Negative for hearing loss.   Eyes: Negative for blurred vision and double vision.  Respiratory: Negative for cough and hemoptysis.   Cardiovascular: Negative for chest pain and palpitations.  Gastrointestinal: Positive for abdominal pain and nausea. Negative for vomiting.  Genitourinary: Negative for dysuria and urgency.  Musculoskeletal: Negative for myalgias and neck pain.  Skin: Negative for itching and rash.  Neurological: Negative for dizziness, tingling and headaches.  Endo/Heme/Allergies: Does not bruise/bleed easily.  Psychiatric/Behavioral: Negative for depression and suicidal ideas.    Blood pressure 121/88, pulse 72, temperature 98.5 F  (36.9 C), temperature source Oral, resp. rate 16, height 5\' 3"  (1.6 m), weight 94 kg, last menstrual period 04/23/2018, SpO2 100 %. Physical Exam  Vitals reviewed. Constitutional: She is oriented to person, place, and time. She appears well-developed and well-nourished.  HENT:  Head: Normocephalic and atraumatic.  Eyes: Pupils are equal, round, and reactive to light. Conjunctivae and EOM are normal.  Neck: Normal range of motion. Neck supple.  Cardiovascular: Normal rate and regular rhythm.  Respiratory: Effort normal and breath sounds normal.  GI: Soft. Bowel sounds are normal. She exhibits no distension. There is no abdominal tenderness.  Musculoskeletal: Normal range of motion.  Neurological: She is alert and oriented to person, place, and time.  Skin: Skin is warm and dry.  Psychiatric: She has a normal mood and affect. Her behavior is normal.     Assessment/Plan 42 yo female with chronic calculous cholecystitis -lap chole -enhanced recovery -planned outpatient procedure  Mickeal Skinner, MD 05/01/2018, 8:44 AM

## 2018-05-01 NOTE — Op Note (Signed)
PATIENT:  Jennifer Kane  42 y.o. female  PRE-OPERATIVE DIAGNOSIS:  Chronic cholecystitis  POST-OPERATIVE DIAGNOSIS:  Chronic cholecystitis  PROCEDURE:  Procedure(s): LAPAROSCOPIC CHOLECYSTECTOMY   SURGEON:  Surgeon(s): Cally Nygard, Arta Bruce, MD  ASSISTANT: none  ANESTHESIA:   local and general  Indications for procedure: [name] is a 42 y.o. female with symptoms of Abdominal pain and Nausea and vomiting consistent with gallbladder disease, Confirmed by Ultrasound.  Description of procedure: The patient was brought into the operative suite, placed supine. Anesthesia was administered with endotracheal tube. Patient was strapped in place and foot board was secured. All pressure points were offloaded by foam padding. The patient was prepped and draped in the usual sterile fashion.  A small incision was made to the right of the umbilicus. A 49mm trocar was inserted into the peritoneal cavity with optical entry. Pneumoperitoneum was applied with high flow low pressure. 2 32mm trocars were placed in the RUQ. A 8mm trocar was placed in the subxiphoid space. Marcaine was infused to the subxiphoid space and lateral upper right abdomen in the transversus abdominis plane. Next the patient was placed in reverse trendelenberg. The gallbladder had lymph filling and was blue/green in color  The gallbladder was retracted cephalad and lateral. The peritoneum was reflected off the infundibulum working lateral to medial. The cystic duct and cystic artery were identified and further dissection revealed a critical view. The cystic duct and cystic artery were doubly clipped and ligated.   The gallbladder was removed off the liver bed with cautery. The Gallbladder was placed in a specimen bag. The gallbladder fossa was irrigated and hemostasis was applied with cautery. The gallbladder was removed via the 33mm trocar. A 1.5cm stone was seen during the removal and placed into the bag. No dilation was  required for removal, therefore no fascial closure was performed. Pneumoperitoneum was removed, all trocar were removed. All incisions were closed with 4-0 monocryl subcuticular stitch. The patient woke from anesthesia and was brought to PACU in stable condition. All counts were correct  Findings: chronically inflamed gallbladder, 1 moderate sized stone  Specimen: gallbladder  Blood loss: <30 ml  Local anesthesia: 30 ml marcaine  Complications: none  PLAN OF CARE: Discharge to home after PACU  PATIENT DISPOSITION:  PACU - hemodynamically stable.  Images:     Gurney Maxin, M.D. General, Bariatric, & Minimally Invasive Surgery [facility]

## 2018-05-01 NOTE — Discharge Instructions (Signed)
General Anesthesia, Adult, Care After  This sheet gives you information about how to care for yourself after your procedure. Your health care provider may also give you more specific instructions. If you have problems or questions, contact your health care provider.  What can I expect after the procedure?  After the procedure, the following side effects are common:  Pain or discomfort at the IV site.  Nausea.  Vomiting.  Sore throat.  Trouble concentrating.  Feeling cold or chills.  Weak or tired.  Sleepiness and fatigue.  Soreness and body aches. These side effects can affect parts of the body that were not involved in surgery.  Follow these instructions at home:    For at least 24 hours after the procedure:  Have a responsible adult stay with you. It is important to have someone help care for you until you are awake and alert.  Rest as needed.  Do not:  Participate in activities in which you could fall or become injured.  Drive.  Use heavy machinery.  Drink alcohol.  Take sleeping pills or medicines that cause drowsiness.  Make important decisions or sign legal documents.  Take care of children on your own.  Eating and drinking  Follow any instructions from your health care provider about eating or drinking restrictions.  When you feel hungry, start by eating small amounts of foods that are soft and easy to digest (bland), such as toast. Gradually return to your regular diet.  Drink enough fluid to keep your urine pale yellow.  If you vomit, rehydrate by drinking water, juice, or clear broth.  General instructions  If you have sleep apnea, surgery and certain medicines can increase your risk for breathing problems. Follow instructions from your health care provider about wearing your sleep device:  Anytime you are sleeping, including during daytime naps.  While taking prescription pain medicines, sleeping medicines, or medicines that make you drowsy.  Return to your normal activities as told by your health care  provider. Ask your health care provider what activities are safe for you.  Take over-the-counter and prescription medicines only as told by your health care provider.  If you smoke, do not smoke without supervision.  Keep all follow-up visits as told by your health care provider. This is important.  Contact a health care provider if:  You have nausea or vomiting that does not get better with medicine.  You cannot eat or drink without vomiting.  You have pain that does not get better with medicine.  You are unable to pass urine.  You develop a skin rash.  You have a fever.  You have redness around your IV site that gets worse.  Get help right away if:  You have difficulty breathing.  You have chest pain.  You have blood in your urine or stool, or you vomit blood.  Summary  After the procedure, it is common to have a sore throat or nausea. It is also common to feel tired.  Have a responsible adult stay with you for the first 24 hours after general anesthesia. It is important to have someone help care for you until you are awake and alert.  When you feel hungry, start by eating small amounts of foods that are soft and easy to digest (bland), such as toast. Gradually return to your regular diet.  Drink enough fluid to keep your urine pale yellow.  Return to your normal activities as told by your health care provider. Ask your health care   provider what activities are safe for you.  This information is not intended to replace advice given to you by your health care provider. Make sure you discuss any questions you have with your health care provider.  Document Released: 08/06/2000 Document Revised: 12/14/2016 Document Reviewed: 12/14/2016  Elsevier Interactive Patient Education  2019 [employer]

## 2018-05-05 ENCOUNTER — Encounter (HOSPITAL_COMMUNITY): Payer: Self-pay | Admitting: General Surgery

## 2018-06-24 DIAGNOSIS — J069 Acute upper respiratory infection, unspecified: Secondary | ICD-10-CM | POA: Diagnosis not present

## 2018-06-24 DIAGNOSIS — Z6834 Body mass index (BMI) 34.0-34.9, adult: Secondary | ICD-10-CM | POA: Diagnosis not present

## 2018-06-24 DIAGNOSIS — J111 Influenza due to unidentified influenza virus with other respiratory manifestations: Secondary | ICD-10-CM | POA: Diagnosis not present

## 2018-07-28 DIAGNOSIS — N764 Abscess of vulva: Secondary | ICD-10-CM | POA: Diagnosis not present

## 2019-07-12 ENCOUNTER — Ambulatory Visit: Payer: BC Managed Care – PPO | Attending: Internal Medicine

## 2019-07-19 ENCOUNTER — Ambulatory Visit: Payer: BC Managed Care – PPO | Attending: Internal Medicine

## 2019-07-19 DIAGNOSIS — Z23 Encounter for immunization: Secondary | ICD-10-CM | POA: Insufficient documentation

## 2019-07-19 NOTE — Progress Notes (Signed)
   Covid-19 Vaccination Clinic  Name:  Jennifer Kane    MRN: [id number] DOB: 08-30-1975  07/19/2019  [name] was observed post Covid-19 immunization for 15 minutes without incident. She was provided with Vaccine Information Sheet and instruction to access the V-Safe system.   [name] was instructed to call 911 with any severe reactions post vaccine: Marland Kitchen Difficulty breathing  . Swelling of face and throat  . A fast heartbeat  . A bad rash all over body  . Dizziness and weakness   Immunizations Administered    Name Date Dose VIS Date Route   Pfizer COVID-19 Vaccine 07/19/2019  9:43 AM 0.3 mL 04/24/2019 Intramuscular   Manufacturer: [place]   Lot: [id number]   [place]: [id number]

## 2019-08-09 ENCOUNTER — Ambulatory Visit: Payer: BC Managed Care – PPO | Attending: Internal Medicine

## 2019-08-09 DIAGNOSIS — Z23 Encounter for immunization: Secondary | ICD-10-CM

## 2019-08-09 NOTE — Progress Notes (Signed)
   Covid-19 Vaccination Clinic  Name:  Jennifer Kane    MRN: [id number] DOB: Feb 07, 1976  08/09/2019  [name] was observed post Covid-19 immunization for 30 minutes based on pre-vaccination screening without incident. She was provided with Vaccine Information Sheet and instruction to access the V-Safe system.   [name] was instructed to call 911 with any severe reactions post vaccine: Marland Kitchen Difficulty breathing  . Swelling of face and throat  . A fast heartbeat  . A bad rash all over body  . Dizziness and weakness   Immunizations Administered    Name Date Dose VIS Date Route   Pfizer COVID-19 Vaccine 08/09/2019  9:15 AM 0.3 mL 04/24/2019 Intramuscular   Manufacturer: [place]   Lot: [id number]   [place]: [id number]

## 2020-01-21 ENCOUNTER — Other Ambulatory Visit: Payer: Self-pay | Admitting: Physician Assistant

## 2020-01-21 DIAGNOSIS — Z1231 Encounter for screening mammogram for malignant neoplasm of breast: Secondary | ICD-10-CM

## 2020-01-25 ENCOUNTER — Other Ambulatory Visit: Payer: Self-pay | Admitting: Physician Assistant

## 2020-01-25 DIAGNOSIS — R921 Mammographic calcification found on diagnostic imaging of breast: Secondary | ICD-10-CM

## 2020-02-05 ENCOUNTER — Other Ambulatory Visit: Payer: Self-pay | Admitting: Physician Assistant

## 2020-02-05 ENCOUNTER — Ambulatory Visit
Admission: RE | Admit: 2020-02-05 | Discharge: 2020-02-05 | Disposition: A | Discharge status: Home / Self Care | Payer: BC Managed Care – PPO | Source: Ambulatory Visit | Attending: Physician Assistant | Admitting: Physician Assistant

## 2020-02-05 ENCOUNTER — Other Ambulatory Visit: Payer: Self-pay

## 2020-02-05 DIAGNOSIS — R921 Mammographic calcification found on diagnostic imaging of breast: Secondary | ICD-10-CM

## 2020-02-23 ENCOUNTER — Other Ambulatory Visit: Payer: Self-pay | Admitting: Family Medicine

## 2020-08-05 ENCOUNTER — Ambulatory Visit
Admission: RE | Admit: 2020-08-05 | Discharge: 2020-08-05 | Disposition: A | Discharge status: Home / Self Care | Payer: BC Managed Care – PPO | Source: Ambulatory Visit | Attending: Physician Assistant | Admitting: Physician Assistant

## 2020-08-05 ENCOUNTER — Other Ambulatory Visit: Payer: Self-pay

## 2020-08-05 DIAGNOSIS — R921 Mammographic calcification found on diagnostic imaging of breast: Secondary | ICD-10-CM

## 2021-06-17 ENCOUNTER — Encounter (HOSPITAL_COMMUNITY): Payer: Self-pay | Admitting: Emergency Medicine

## 2021-06-17 ENCOUNTER — Emergency Department (HOSPITAL_COMMUNITY)
Admission: EM | Admit: 2021-06-17 | Discharge: 2021-06-17 | Disposition: A | Discharge status: Home / Self Care | Payer: BC Managed Care – PPO | Attending: Emergency Medicine | Admitting: Emergency Medicine

## 2021-06-17 ENCOUNTER — Other Ambulatory Visit: Payer: Self-pay

## 2021-06-17 DIAGNOSIS — N764 Abscess of vulva: Secondary | ICD-10-CM | POA: Insufficient documentation

## 2021-06-17 MED ORDER — OXYCODONE-ACETAMINOPHEN 5-325 MG PO TABS
1.0000 | ORAL_TABLET | Freq: Once | ORAL | Status: AC
  Administered 2021-06-17: 1 via ORAL
  Filled 2021-06-17: qty 1

## 2021-06-17 MED ORDER — LIDOCAINE HCL (PF) 1 % IJ SOLN
10.0000 mL | Freq: Once | INTRAMUSCULAR | Status: AC
  Administered 2021-06-17: 10 mL
  Filled 2021-06-17: qty 10

## 2021-06-17 MED ORDER — FENTANYL CITRATE PF 50 MCG/ML IJ SOSY
50.0000 ug | PREFILLED_SYRINGE | Freq: Once | INTRAMUSCULAR | Status: AC
  Administered 2021-06-17: 50 ug via INTRAVENOUS
  Filled 2021-06-17: qty 1

## 2021-06-17 NOTE — ED Provider Notes (Signed)
[facility] EMERGENCY DEPARTMENT Provider Note   CSN: [id number] Arrival date & time: 06/17/21  1025     History  Chief Complaint  Patient presents with   Abscess    Jennifer Kane is a 46 y.o. female.  Patient with no pertinent past medical history presents today with chief complaint of abscess.  She states that same originally appeared on Monday of this week on the right side of her labia as a small whitehead. She states that it then progressively got bigger over the next few days. Attempted to use warm baths with Epsom salts and warm compresses with no success. States that she had same in the past around 6 months ago but it spontaneously resolved with supportive measures. States that she has had several abscesses in the past but has never required ED incision and drainage. She went to her primary care doctor earlier today who sent her here for management.  The history is provided by the patient. No language interpreter was used.  Abscess Associated symptoms: no fever       Home Medications Prior to Admission medications   Medication Sig Start Date End Date Taking? Authorizing Provider  EPINEPHrine (EPIPEN 2-PAK) 0.3 mg/0.3 mL IJ SOAJ injection Inject 0.3 mg into the muscle once.    [provider]  HYDROcodone-acetaminophen (NORCO/VICODIN) 5-325 MG tablet Take 1 tablet by mouth every 6 (six) hours as needed for moderate pain. 05/01/18   Kinsinger, Arta Bruce, MD  ibuprofen (ADVIL,MOTRIN) 800 MG tablet Take 1 tablet (800 mg total) by mouth every 8 (eight) hours as needed. 05/01/18   Kinsinger, Arta Bruce, MD      Allergies    Meat [alpha-gal]    Review of Systems   Review of Systems  Constitutional:  Negative for chills and fever.  Skin:  Positive for wound.  All other systems reviewed and are negative.  Physical Exam Updated Vital Signs BP 116/73 (BP Location: Right Arm)    Pulse 84    Temp 98.3 F (36.8 C) (Oral)    Resp 16    Ht 5\' 3"  (1.6  m)    Wt 93.9 kg    LMP 06/03/2021    SpO2 99%    BMI 36.67 kg/m  Physical Exam Vitals and nursing note reviewed. Exam conducted with a chaperone present.  Constitutional:      Appearance: Normal appearance. She is normal weight.     Comments: Patient resting comfortably in bed in no acute distress  HENT:     Head: Normocephalic and atraumatic.  Eyes:     Extraocular Movements: Extraocular movements intact.     Pupils: Pupils are equal, round, and reactive to light.  Cardiovascular:     Rate and Rhythm: Normal rate.  Pulmonary:     Effort: Pulmonary effort is normal.  Abdominal:     General: Abdomen is flat.     Palpations: Abdomen is soft.  Genitourinary:    Comments: 2 cm x 2 cm area of fluctuance noted to the right labia. Significant tenderness noted. No active drainage present. Musculoskeletal:     Cervical back: Normal range of motion.  Skin:    General: Skin is warm and dry.  Neurological:     General: No focal deficit present.     Mental Status: She is alert.  Psychiatric:        Mood and Affect: Mood normal.        Behavior: Behavior normal.    ED Results /  Procedures / Treatments   Labs (all labs ordered are listed, but only abnormal results are displayed) Labs Reviewed - No data to display  EKG None  Radiology No results found.  Procedures .Marland KitchenIncision and Drainage  Date/Time: 06/17/2021 1:19 PM Performed by: Bud Face, PA-C Authorized by: Bud Face, PA-C   Consent:    Consent obtained:  Verbal   Consent given by:  Patient   Risks, benefits, and alternatives were discussed: yes     Risks discussed:  Bleeding, incomplete drainage, pain, damage to other organs and infection   Alternatives discussed:  No treatment, delayed treatment, alternative treatment, observation and referral Universal protocol:    Procedure explained and questions answered to patient or proxy's satisfaction: yes     Required blood products, implants, devices, and special  equipment available: yes     Patient identity confirmed:  Verbally with patient Location:    Type:  Abscess   Size:  2 cm x 2 cm   Location:  Anogenital   Anogenital location: Right labia. Pre-procedure details:    Skin preparation:  Povidone-iodine Anesthesia:    Anesthesia method:  Local infiltration   Local anesthetic:  Lidocaine 1% w/o epi Procedure type:    Complexity:  Simple Procedure details:    Incision types:  Single straight   Drainage:  Bloody and purulent   Drainage amount:  Copious   Wound treatment:  Drain placed (Word cathetar placed)   Packing materials:  Word catheter Post-procedure details:    Procedure completion:  Tolerated well, no immediate complications    Medications Ordered in ED Medications  lidocaine (PF) (XYLOCAINE) 1 % injection 10 mL (has no administration in time range)  oxyCODONE-acetaminophen (PERCOCET/ROXICET) 5-325 MG per tablet 1 tablet (has no administration in time range)    ED Course/ Medical Decision Making/ A&P                           Medical Decision Making Risk Prescription drug management.   Patient presents today with right labial abscess amenable to incision and drainage.  Wound is extremely superficial with no evidence of tracking, therefore no other labs or imaging indicated at this time.  Word catheter placed in wound with 3 cc saline, will recommend wound recheck and catheter removal with PCP in 2 days. Encouraged home warm soaks and flushing.  Mild signs of cellulitis is surrounding skin.  She is afebrile, nontoxic-appearing, and in no acute distress with reassuring vital signs.  No comorbidities that would indicate poor wound healing, therefore no antibiotic therapy is indicated. Stable for discharge at this time, educated on red flag symptoms of prompt immediate return.  Discharged in stable condition.   Final Clinical Impression(s) / ED Diagnoses Final diagnoses:  Labial abscess    Rx / DC Orders ED Discharge  Orders     None     An After Visit Summary was printed and given to the patient.     Nestor Lewandowsky 06/17/21 1344    Wyvonnia Dusky, MD 06/17/21 1414

## 2021-06-17 NOTE — ED Notes (Signed)
Pt ambulatory to restroom with steady gait.

## 2021-06-17 NOTE — Discharge Instructions (Addendum)
As we discussed, a small catheter was placed in your incision site to ensure that it stays open.  You will need to follow-up with your primary care doctor in the next 2 to 3 days for a wound check and removal of this catheter.  In the interim, I recommend warm baths with soap and water to ensure that this wound stays open.  I also recommend wearing a pad as this wound may continue to drain, this is normal and to be expected.  Return if development of any fevers, chills or any other new or worsening symptoms.

## 2021-06-17 NOTE — ED Triage Notes (Signed)
Patient reports abscess to right side of labia over the past week that has increased in size. Reports hx of same.

## 2021-07-20 ENCOUNTER — Other Ambulatory Visit: Payer: Self-pay | Admitting: Physician Assistant

## 2021-07-20 DIAGNOSIS — Z1231 Encounter for screening mammogram for malignant neoplasm of breast: Secondary | ICD-10-CM

## 2021-08-21 ENCOUNTER — Ambulatory Visit
Admission: RE | Admit: 2021-08-21 | Discharge: 2021-08-21 | Disposition: A | Discharge status: Home / Self Care | Payer: BC Managed Care – PPO | Source: Ambulatory Visit | Attending: Physician Assistant | Admitting: Physician Assistant

## 2021-08-21 DIAGNOSIS — Z1231 Encounter for screening mammogram for malignant neoplasm of breast: Secondary | ICD-10-CM

## 2022-01-31 ENCOUNTER — Ambulatory Visit (INDEPENDENT_AMBULATORY_CARE_PROVIDER_SITE_OTHER): Payer: BC Managed Care – PPO | Admitting: Allergy & Immunology

## 2022-01-31 ENCOUNTER — Encounter: Payer: Self-pay | Admitting: Allergy & Immunology

## 2022-01-31 VITALS — BP 126/78 | HR 94 | Temp 98.1°F | Resp 16 | Ht 64.0 in | Wt 240.0 lb

## 2022-01-31 DIAGNOSIS — J3089 Other allergic rhinitis: Secondary | ICD-10-CM

## 2022-01-31 DIAGNOSIS — J4540 Moderate persistent asthma, uncomplicated: Secondary | ICD-10-CM | POA: Diagnosis not present

## 2022-01-31 DIAGNOSIS — T7800XD Anaphylactic reaction due to unspecified food, subsequent encounter: Secondary | ICD-10-CM | POA: Diagnosis not present

## 2022-01-31 DIAGNOSIS — J302 Other seasonal allergic rhinitis: Secondary | ICD-10-CM | POA: Insufficient documentation

## 2022-01-31 DIAGNOSIS — T7800XA Anaphylactic reaction due to unspecified food, initial encounter: Secondary | ICD-10-CM | POA: Insufficient documentation

## 2022-01-31 MED ORDER — RYALTRIS 665-25 MCG/ACT NA SUSP: 1.0000 | Freq: Two times a day (BID) | NASAL | 5 refills | Status: DC | PRN

## 2022-01-31 MED ORDER — LEVALBUTEROL TARTRATE 45 MCG/ACT IN AERO: 2.0000 | INHALATION_SPRAY | Freq: Four times a day (QID) | RESPIRATORY_TRACT | 2 refills | Status: DC | PRN

## 2022-01-31 MED ORDER — BUDESONIDE-FORMOTEROL FUMARATE 160-4.5 MCG/ACT IN AERO: 2.0000 | INHALATION_SPRAY | Freq: Two times a day (BID) | RESPIRATORY_TRACT | 5 refills | Status: DC | PRN

## 2022-01-31 NOTE — Progress Notes (Signed)
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 Moderate persistent asthma, uncomplicated - Lung testing was slightly abnormal, but it did improve to normal range following albuterol treatment. - I think he would benefit from use of Symbicort, but we are going to use it as a Single Maintenance and Reliever Therapy Regimen (SMART). - Spacer sample and demonstration provided. - Daily controller medication(s): Symbicort 160/4.12mg ONE PUFF ONCE DAILY - Prior to physical activity: albuterol 2 puffs OR one puff of Symbicort 10-15 minutes before physical activity. - Rescue medications: albuterol 4 puffs every 4-6 hours as needed OR Symbicort 2 PUFFS every 4-6 hours as needed - Changes during respiratory infections or worsening symptoms: Increase Symbicort TWO PUFFS twice daily for TWO WEEKS. - Asthma control goals:  * Full participation in all desired activities (may need albuterol before activity) * Albuterol use two time or less a week on average (not counting use with activity) * Cough interfering with sleep two time or less a month * Oral steroids no more than once a year * No hospitalizations  2. Perennial and seasonal allergic rhinitis - Testing today showed: indoor molds, outdoor molds, dust mites, cat, dog, and cockroach - Copy of test results provided.  - Avoidance measures provided. - Continue with: Zyrtec (cetirizine) '10mg'$  tablet once daily - Start taking: Ryaltris (olopatadine/mometasone) two sprays per nostril 1-2 times daily as needed (SAMPLE PROVIDED) - You can use an extra dose of the antihistamine, if needed, for breakthrough symptoms.  - Consider nasal saline rinses 1-2 times  daily to remove allergens from the nasal cavities as well as help with mucous clearance (this is especially helpful to do before the nasal sprays are given) - Consider allergy shots as a means of long-term control. - Allergy shots "re-train" and "reset" the immune system to ignore environmental allergens and decrease the resulting immune response to those allergens (sneezing, itchy watery eyes, runny nose, nasal congestion, etc).    - Allergy shots improve symptoms in 75-85% of patients.  - We can discuss more at the next appointment if the medications are not working for you.  3. 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 - EpiPen is up to date. - We can check annually to see if it decreases.   4. Return in about 6 weeks (around 03/14/2022).    This note in its entirety was forwarded to the Provider who requested this consultation.  Subjective:   Jennifer Kane a 46y.o. female presenting today for evaluation of  Chief Complaint  Patient presents with   Allergic Reaction    Has had alpha- gal for 5 years now. Found out around the time she had her gallbladder removed.    Allergic Rhinitis     Sneezing, coughing, throat clearing, wheezing,and sinus issues    Asthma    Never offically diagnosed with asthma     Jennifer BARLEYhas a history of the following: Patient Active Problem List   Diagnosis Date Noted   Moderate persistent asthma, uncomplicated 041/32/4401  Seasonal and perennial allergic rhinitis 01/31/2022   Anaphylactic shock due to adverse food reaction 01/31/2022    History obtained from: chart review and patient.  Nira Conn was referred by Rosalee Kaufman, PA-C.     Jennifer Kane is a 46 y.o. female presenting for an evaluation of environmental allergies and other atopic complaints .   Asthma/Respiratory Symptom History: She is treated at [facility] as needed for "asthmatic bronchitis" during every season change. She does  get steroid shots. She does not get the shot four times per year, but fall is typically the worse time of the year for her symptoms. She has needed a lot of prednisone shots for this crud over the last few years. She has the albuterol inhaler only; she has never been on a daily medication for her symptoms.   Allergic Rhinitis Symptom History: She also has environmental allergies. These symptoms occur more since she has been working in [employer] school system. 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 She works at [employer] . Zyrtec makes it manageable. She has not had it since May 2023. She did not go outdoors during the summer so she has not had any problems during the summer months.  They go to [facility] and [place] and [place]. She developed allergies as she got older.   Food Allergy Symptom History: She had alpha gal diagnosed before she had her gallbladder. She had this taken out. But she was also diagnosed with alpha gal. She did have some bacon since the diagnosis (she has had 4-5 accidental exposures ). This might have happened within the last 3-4 months accidentally. Initially her level was not too high. It was trending down, but then it increased again after a tick in May 2023. She has not touched red neat at all since.   Otherwise, there is no history of other atopic diseases, including drug allergies, stinging insect allergies, eczema, urticaria, or contact dermatitis. There is no significant infectious history. Vaccinations are up to date.    Past Medical History: Patient Active Problem List   Diagnosis Date Noted   Moderate persistent asthma, uncomplicated 66/44/0347   Seasonal and perennial allergic rhinitis 01/31/2022   Anaphylactic shock due to adverse food reaction 01/31/2022    Medication List:  Allergies as of 01/31/2022       Reactions   Meat [alpha-gal]         Medication List         Accurate as of January 31, 2022 10:24 PM. If you have any questions, ask your nurse or doctor.          EpiPen 2-Pak 0.3 mg/0.3 mL Soaj injection Generic drug: EPINEPHrine Inject 0.3 mg into the muscle once.   escitalopram 10 MG tablet Commonly known as: LEXAPRO Take 10 mg by mouth daily.   HYDROcodone-acetaminophen 5-325 MG tablet Commonly known as: NORCO/VICODIN Take 1 tablet by mouth every 6 (six) hours as needed for moderate pain.   ibuprofen 800 MG tablet Commonly known as: ADVIL Take 1 tablet (800 mg total) by mouth every 8 (eight) hours as needed.   levalbuterol 45 MCG/ACT inhaler Commonly known as: XOPENEX HFA Inhale into the lungs.   promethazine 25 MG tablet Commonly known as: PHENERGAN Take 25 mg by mouth every 8 (eight) hours as needed.   rizatriptan 10 MG tablet Commonly known as: MAXALT Take 10 mg by mouth as directed.        Birth History: non-contributory  Developmental History: non-contributory  Past Surgical History: Past Surgical History:  Procedure Laterality Date   CHOLECYSTECTOMY N/A 05/01/2018  Procedure: LAPAROSCOPIC CHOLECYSTECTOMY;  Surgeon: Kinsinger, Arta Bruce, MD;  Location: [facility];  Service: General;  Laterality: N/A;   KNEE ARTHROSCOPY  1989   No previous surgeries     WISDOM TOOTH EXTRACTION       Family History: Family History  Problem Relation Age of Onset   Breast cancer Mother        10s   CAD Father      Social History: Ladeana lives at home with her husband. They live a in house that was built in 1975.  There is wood and carpeting in the family grams and carpeting in the bedroom.  They have gas heating and central cooling.  There is a dog at at bedtime inside of the home.  There are no dust mite covers on the bedding.  There is no tobacco exposure.  She currently works as a Oncologist.  There are no fume, chemical, or dust exposures.  They do not live near an interstate or industrial area.   Her husband works as a Airline pilot.   Review of Systems  Constitutional: Negative.  Negative for fever, malaise/fatigue and weight loss.  HENT:  Positive for congestion and sinus pain. Negative for ear discharge and ear pain.        Positive for postnasal drip.  Eyes:  Negative for pain, discharge and redness.  Respiratory:  Negative for cough, sputum production, shortness of breath and wheezing.   Cardiovascular: Negative.  Negative for chest pain and palpitations.  Gastrointestinal:  Negative for abdominal pain, constipation, diarrhea, heartburn, nausea and vomiting.  Skin: Negative.  Negative for itching and rash.  Neurological:  Negative for dizziness and headaches.  Endo/Heme/Allergies:  Negative for environmental allergies. Does not bruise/bleed easily.       Objective:   Blood pressure 126/78, pulse 94, temperature 98.1 F (36.7 C), resp. rate 16, height '5\' 4"'$  (1.626 m), weight 240 lb (108.9 kg), SpO2 96 %. Body mass index is 41.2 kg/m.     Physical Exam Constitutional:      Appearance: She is well-developed.  HENT:     Head: Normocephalic and atraumatic.     Right Ear: Tympanic membrane, ear canal and external ear normal. No drainage, swelling or tenderness. Tympanic membrane is not injected, scarred, erythematous, retracted or bulging.     Left Ear: Tympanic membrane, ear canal and external ear normal. No drainage, swelling or tenderness. Tympanic membrane is not injected, scarred, erythematous, retracted or bulging.     Nose: No nasal deformity, septal deviation, mucosal edema or rhinorrhea.     Right Turbinates: Enlarged, swollen and pale.     Left Turbinates: Enlarged, swollen and pale.     Right Sinus: No maxillary sinus tenderness or frontal sinus tenderness.     Left Sinus: No maxillary sinus tenderness or frontal sinus tenderness.     Comments: Clear rhinorrhea. No septa deviation.     Mouth/Throat:     Lips: Pink.     Mouth: Mucous membranes are moist.  Mucous membranes are not pale and not dry.     Pharynx: Uvula midline.     Comments: There is some cobblestoning noted.  Eyes:     General: Allergic shiner present.        Right eye: No discharge.        Left eye: No discharge.     Conjunctiva/sclera: Conjunctivae normal.     Right eye: Right conjunctiva is not injected. No chemosis.    Left eye: Left conjunctiva  is not injected. No chemosis.    Pupils: Pupils are equal, round, and reactive to light.  Cardiovascular:     Rate and Rhythm: Normal rate and regular rhythm.     Heart sounds: Normal heart sounds.  Pulmonary:     Effort: Pulmonary effort is normal. No tachypnea, accessory muscle usage or respiratory distress.     Breath sounds: Normal breath sounds. No wheezing, rhonchi or rales.  Chest:     Chest wall: No tenderness.  Abdominal:     Tenderness: There is no abdominal tenderness. There is no guarding or rebound.  Lymphadenopathy:     Head:     Right side of head: No submandibular, tonsillar or occipital adenopathy.     Left side of head: No submandibular, tonsillar or occipital adenopathy.     Cervical: No cervical adenopathy.  Skin:    Coloration: Skin is not pale.     Findings: No abrasion, erythema, petechiae or rash. Rash is not papular, urticarial or vesicular.  Neurological:     Mental Status: She is alert.      Diagnostic studies:    Spirometry: results normal (FEV1: 2.19 L /77%, FVC: 2.66 L/76%, FEV1/FVC: 82%).    Spirometry consistent with mild obstructive disease. Albuterol four puffs via MDI treatment given in clinic with improvement in FEV1 and FVC, but not significant per ATS criteria.  The FEV1 improved 9% and FVC improved 7%.  Allergy Studies:     Airborne Adult Perc - 01/31/22 1400     Time Antigen Placed 1458    Allergen Manufacturer Lavella Hammock    Location Back    Number of Test 59    Panel 1 Select    1. Control-Buffer 50% Glycerol Negative    2. Control-Histamine 1 mg/ml 2+    3. Albumin  saline Negative    4. Latimer Negative    5. [place] Negative    6. Johnson Negative    7. Lodi Blue Negative    8. Meadow Fescue Negative    9. Perennial Rye Negative    10. Sweet Vernal Negative    11. Timothy Negative    12. Cocklebur Negative    13. Burweed Marshelder Negative    14. Ragweed, short Negative    15. Ragweed, Giant Negative    16. Plantain,  English Negative    17. Lamb's Quarters Negative    18. Sheep Sorrell Negative    19. Rough Pigweed Negative    20. Marsh Elder, Rough Negative    21. Mugwort, Common Negative    22. Ash mix Negative    23. Birch mix Negative    24. Beech American Negative    25. Box, Elder Negative    26. Cedar, red Negative    27. Cottonwood, [place] Negative    28. Elm mix Negative    29. Hickory Negative    30. Maple mix Negative    31. Oak, [place] mix Negative    32. Pecan Pollen Negative    33. Pine mix Negative    34. Sycamore Eastern Negative    35. [place], Black Pollen Negative    36. Alternaria alternata Negative    37. Cladosporium Herbarum Negative    38. Aspergillus mix Negative    39. Penicillium mix Negative    40. Bipolaris sorokiniana (Helminthosporium) Negative    41. Drechslera spicifera (Curvularia) Negative    42. Mucor plumbeus Negative    43. Fusarium moniliforme Negative    44. Aureobasidium pullulans (pullulara)  Negative    45. Rhizopus oryzae Negative    46. Botrytis cinera Negative    47. Epicoccum nigrum Negative    48. Phoma betae Negative    49. Candida Albicans Negative    50. Trichophyton mentagrophytes Negative    51. Mite, D Farinae  5,000 AU/ml Negative    52. Mite, D Pteronyssinus  5,000 AU/ml Negative    53. Cat Hair 10,000 BAU/ml Negative    54.  Dog Epithelia Negative    55. Mixed Feathers Negative    56. Horse Epithelia Negative    57. Cockroach, German Negative    58. Mouse Negative    59. Tobacco Leaf Negative             Intradermal - 01/31/22 1549     Time Antigen  Placed 1549    Allergen Manufacturer Lavella Hammock    Location Arm    Number of Test 15    Control Negative    Guatem[place] Negative    Johnson Negative    7 Grass Negative    Ragweed mix Negative    Weed mix Negative    Tree mix Negative    Mold 1 2+    Mold 2 2+    Mold 3 2+    Mold 4 2+    Cat 3+    Dog 2+    Cockroach 1+    Mite mix 2+             Allergy testing results were read and interpreted by myself, documented by clinical staff.         Salvatore Marvel, MD [facility]

## 2022-01-31 NOTE — Patient Instructions (Addendum)
1. Moderate persistent asthma, uncomplicated - Lung testing was slightly abnormal, but it did improve to normal range following albuterol treatment. - I think he would benefit from use of Symbicort, but we are going to use it as a Single Maintenance and Reliever Therapy Regimen (SMART). - Spacer sample and demonstration provided. - Daily controller medication(s): Symbicort 160/4.72mg ONE PUFF ONCE DAILY - Prior to physical activity: albuterol 2 puffs OR one puff of Symbicort 10-15 minutes before physical activity. - Rescue medications: albuterol 4 puffs every 4-6 hours as needed OR Symbicort 2 PUFFS every 4-6 hours as needed - Changes during respiratory infections or worsening symptoms: Increase Symbicort TWO PUFFS twice daily for TWO WEEKS. - Asthma control goals:  * Full participation in all desired activities (may need albuterol before activity) * Albuterol use two time or less a week on average (not counting use with activity) * Cough interfering with sleep two time or less a month * Oral steroids no more than once a year * No hospitalizations  2. Perennial and seasonal allergic rhinitis - Testing today showed: indoor molds, outdoor molds, dust mites, cat, dog, and cockroach - Copy of test results provided.  - Avoidance measures provided. - Continue with: Zyrtec (cetirizine) '10mg'$  tablet once daily - Start taking: Ryaltris (olopatadine/mometasone) two sprays per nostril 1-2 times daily as needed (SAMPLE PROVIDED) - You can use an extra dose of the antihistamine, if needed, for breakthrough symptoms.  - Consider nasal saline rinses 1-2 times daily to remove allergens from the nasal cavities as well as help with mucous clearance (this is especially helpful to do before the nasal sprays are given) - Consider allergy shots as a means of long-term control. - Allergy shots "re-train" and "reset" the immune system to ignore environmental allergens and decrease the resulting immune response to  those allergens (sneezing, itchy watery eyes, runny nose, nasal congestion, etc).    - Allergy shots improve symptoms in 75-85% of patients.  - We can discuss more at the next appointment if the medications are not working for you.  3. 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 - EpiPen is up to date. - We can check annually to see if it decreases.   4. Return in about 6 weeks (around 03/14/2022).    Please inform [place] any Emergency Department visits, hospitalizations, or changes in symptoms. Call [place] going to the ED for breathing or allergy symptoms since we might be able to fit you in for a sick visit. Feel free to contact [place]time with any questions, problems, or concerns.  It was a pleasure to meet you today!  Websites that have reliable patient information: 1. American Academy of Asthma, Allergy, and Immunology: www.aaaai.org 2. Food Allergy Research and Education (FARE): foodallergy.org 3. Mothers of Asthmatics: http://www.asthmacommunitynetwork.org 4. American College of Allergy, Asthma, and Immunology: www.acaai.org   COVID-19 Vaccine Information can be found at: [email or web address] questions related to vaccine distribution or appointments, please email [email or web address] or call [phone]   We realize that you might be concerned about having an allergic reaction to the COVID19 vaccines. To help with that concern, WE ARE OFFERING THE COVID19 VACCINES IN OUR OFFICE! Ask the front desk for dates!     "Like" [facility] Facebook and Instagram for our latest updates!      A healthy democracy works best when ANew York Life Insuranceparticipate! Make sure you are registered to vote! If you have moved or changed any of your  contact information, you will need to get this updated before voting!  In some cases, you MAY be able to register to vote online:  [email or web address]      Airborne Adult Perc - 01/31/22 1400     Time Antigen Placed [place]    Location Back    Number of Test 59    Panel 1 Select    1. Control-Buffer 50% Glycerol Negative    2. Control-Histamine 1 mg/ml 2+    3. Albumin saline Negative    4. [place] Negative    5. [place] Negative    6. Johnson Negative    7. [place] Blue Negative    8. Meadow Fescue Negative    9. Perennial Rye Negative    10. Sweet Vernal Negative    11. Timothy Negative    12. Cocklebur Negative    13. Burweed Marshelder Negative    14. Ragweed, short Negative    15. Ragweed, Giant Negative    16. Plantain,  English Negative    17. Lamb's Quarters Negative    18. Sheep Sorrell Negative    19. Rough Pigweed Negative    20. Marsh Elder, Rough Negative    21. Mugwort, Common Negative    22. Ash mix Negative    23. Birch mix Negative    24. Beech American Negative    25. Box, Elder Negative    26. Cedar, red Negative    27. Cottonwood, [place] Negative    28. Elm mix Negative    29. Hickory Negative    30. Maple mix Negative    31. Oak, [place] mix Negative    32. Pecan Pollen Negative    33. Pine mix Negative    34. Sycamore Eastern Negative    35. [place], Black Pollen Negative    36. Alternaria alternata Negative    37. Cladosporium Herbarum Negative    38. Aspergillus mix Negative    39. Penicillium mix Negative    40. Bipolaris sorokiniana (Helminthosporium) Negative    41. Drechslera spicifera (Curvularia) Negative    42. Mucor plumbeus Negative    43. Fusarium moniliforme Negative    44. Aureobasidium pullulans (pullulara) Negative    45. Rhizopus oryzae Negative    46. Botrytis cinera Negative    47. Epicoccum nigrum Negative    48. Phoma betae Negative    49. Candida Albicans Negative    50. Trichophyton mentagrophytes Negative    51. Mite, D Farinae  5,000 AU/ml Negative    52. Mite, D Pteronyssinus   5,000 AU/ml Negative    53. Cat Hair 10,000 BAU/ml Negative    54.  Dog Epithelia Negative    55. Mixed Feathers Negative    56. Horse Epithelia Negative    57. Cockroach, German Negative    58. Mouse Negative    59. Tobacco Leaf Negative             Intradermal - 01/31/22 1549     Time Antigen Placed 1549    Allergen Manufacturer Lavella Hammock    Location Arm    Number of Test 15    Control Negative    [place] Negative    Johnson Negative    7 Grass Negative    Ragweed mix Negative    Weed mix Negative    Tree mix Negative    Mold 1 2+    Mold 2 2+    Mold 3 2+    Mold 4  2+    Cat 3+    Dog 2+    Cockroach 1+    Mite mix 2+             Control of Mold Allergen   Mold and fungi can grow on a variety of surfaces provided certain temperature and moisture conditions exist.  Outdoor molds grow on plants, decaying vegetation and soil.  The major outdoor mold, Alternaria and Cladosporium, are found in very high numbers during hot and dry conditions.  Generally, a late Summer - Fall peak is seen for common outdoor fungal spores.  Rain will temporarily lower outdoor mold spore count, but counts rise rapidly when the rainy period ends.  The most important indoor molds are Aspergillus and Penicillium.  Dark, humid and poorly ventilated basements are ideal sites for mold growth.  The next most common sites of mold growth are the bathroom and the kitchen.  Outdoor (Seasonal) Mold Control  Use air conditioning and keep windows closed Avoid exposure to decaying vegetation. Avoid leaf raking. Avoid grain handling. Consider wearing a face mask if working in moldy areas.    Indoor (Perennial) Mold Control   Maintain humidity below 50%. Clean washable surfaces with 5% bleach solution. Remove sources e.g. contaminated carpets.     Control of Dust Mite Allergen    Dust mites play a major role in allergic asthma and rhinitis.  They occur in environments with high humidity  wherever human skin is found.  Dust mites absorb humidity from the atmosphere (ie, they do not drink) and feed on organic matter (including shed human and animal skin).  Dust mites are a microscopic type of insect that you cannot see with the naked eye.  High levels of dust mites have been detected from mattresses, pillows, carpets, upholstered furniture, bed covers, clothes, soft toys and any woven material.  The principal allergen of the dust mite is found in its feces.  A gram of dust may contain 1,000 mites and 250,000 fecal particles.  Mite antigen is easily measured in the air during house cleaning activities.  Dust mites do not bite and do not cause harm to humans, other than by triggering allergies/asthma.    Ways to decrease your exposure to dust mites in your home:  Encase mattresses, box springs and pillows with a mite-impermeable barrier or cover   Wash sheets, blankets and drapes weekly in hot water (130 F) with detergent and dry them in a dryer on the hot setting.  Have the room cleaned frequently with a vacuum cleaner and a damp dust-mop.  For carpeting or rugs, vacuuming with a vacuum cleaner equipped with a high-efficiency particulate air (HEPA) filter.  The dust mite allergic individual should not be in a room which is being cleaned and should wait 1 hour after cleaning before going into the room. Do not sleep on upholstered furniture (eg, couches).   If possible removing carpeting, upholstered furniture and drapery from the home is ideal.  Horizontal blinds should be eliminated in the rooms where the person spends the most time (bedroom, study, television room).  Washable vinyl, roller-type shades are optimal. Remove all non-washable stuffed toys from the bedroom.  Wash stuffed toys weekly like sheets and blankets above.   Reduce indoor humidity to less than 50%.  Inexpensive humidity monitors can be purchased at most hardware stores.  Do not use a humidifier as can make the problem  worse and are not recommended.   Control of Dog or Cat Allergen  Avoidance is the best way to manage a dog or cat allergy. If you have a dog or cat and are allergic to dog or cats, consider removing the dog or cat from the home. If you have a dog or cat but don't want to find it a new home, or if your family wants a pet even though someone in the household is allergic, here are some strategies that may help keep symptoms at bay:  Keep the pet out of your bedroom and restrict it to only a few rooms. Be advised that keeping the dog or cat in only one room will not limit the allergens to that room. Don't pet, hug or kiss the dog or cat; if you do, wash your hands with soap and water. High-efficiency particulate air (HEPA) cleaners run continuously in a bedroom or living room can reduce allergen levels over time. Regular use of a high-efficiency vacuum cleaner or a central vacuum can reduce allergen levels. Giving your dog or cat a bath at least once a week can reduce airborne allergen.  Control of Mold Allergen   Mold and fungi can grow on a variety of surfaces provided certain temperature and moisture conditions exist.  Outdoor molds grow on plants, decaying vegetation and soil.  The major outdoor mold, Alternaria and Cladosporium, are found in very high numbers during hot and dry conditions.  Generally, a late Summer - Fall peak is seen for common outdoor fungal spores.  Rain will temporarily lower outdoor mold spore count, but counts rise rapidly when the rainy period ends.  The most important indoor molds are Aspergillus and Penicillium.  Dark, humid and poorly ventilated basements are ideal sites for mold growth.  The next most common sites of mold growth are the bathroom and the kitchen.  Outdoor (Seasonal) Mold Control  Use air conditioning and keep windows closed Avoid exposure to decaying vegetation. Avoid leaf raking. Avoid grain handling. Consider wearing a face mask if working in  moldy areas.   Indoor (Perennial) Mold Control    Maintain humidity below 50%. Clean washable surfaces with 5% bleach solution. Remove sources e.g. contaminated carpets.    Control of Cockroach Allergen  Cockroach allergen has been identified as an important cause of acute attacks of asthma, especially in urban settings.  There are fifty-five species of cockroach that exist in the [place], however only three, the [place], [place] species produce allergen that can affect patients with Asthma.  Allergens can be obtained from fecal particles, egg casings and secretions from cockroaches.    Remove food sources. Reduce access to water. Seal access and entry points. Spray runways with 0.5-1% Diazinon or Chlorpyrifos Blow boric acid power under stoves and refrigerator. Place bait stations (hydramethylnon) at feeding sites.  Allergy Shots   Allergies are the result of a chain reaction that starts in the immune system. Your immune system controls how your body defends itself. For instance, if you have an allergy to pollen, your immune system identifies pollen as an invader or allergen. Your immune system overreacts by producing antibodies called Immunoglobulin E (IgE). These antibodies travel to cells that release chemicals, causing an allergic reaction.  The concept behind allergy immunotherapy, whether it is received in the form of shots or tablets, is that the immune system can be desensitized to specific allergens that trigger allergy symptoms. Although it requires time and patience, the payback can be long-term relief.  How Do Allergy Shots Work?  Allergy shots work much like a  vaccine. Your body responds to injected amounts of a particular allergen given in increasing doses, eventually developing a resistance and tolerance to it. Allergy shots can lead to decreased, minimal or no allergy symptoms.  There generally are two phases: build-up and maintenance. Build-up  often ranges from three to six months and involves receiving injections with increasing amounts of the allergens. The shots are typically given once or twice a week, though more rapid build-up schedules are sometimes used.  The maintenance phase begins when the most effective dose is reached. This dose is different for each person, depending on how allergic you are and your response to the build-up injections. Once the maintenance dose is reached, there are longer periods between injections, typically two to four weeks.  Occasionally doctors give cortisone-type shots that can temporarily reduce allergy symptoms. These types of shots are different and should not be confused with allergy immunotherapy shots.  Who Can Be Treated with Allergy Shots?  Allergy shots may be a good treatment approach for people with allergic rhinitis (hay fever), allergic asthma, conjunctivitis (eye allergy) or stinging insect allergy.   Before deciding to begin allergy shots, you should consider:   The length of allergy season and the severity of your symptoms  Whether medications and/or changes to your environment can control your symptoms  Your desire to avoid long-term medication use  Time: allergy immunotherapy requires a major time commitment  Cost: may vary depending on your insurance coverage  Allergy shots for children age 32 and older are effective and often well tolerated. They might prevent the onset of new allergen sensitivities or the progression to asthma.  Allergy shots are not started on patients who are pregnant but can be continued on patients who become pregnant while receiving them. In some patients with other medical conditions or who take certain common medications, allergy shots may be of risk. It is important to mention other medications you talk to your allergist.   When Will I Feel Better?  Some may experience decreased allergy symptoms during the build-up phase. For others, it may take  as long as 12 months on the maintenance dose. If there is no improvement after a year of maintenance, your allergist will discuss other treatment options with you.  If you aren't responding to allergy shots, it may be because there is not enough dose of the allergen in your vaccine or there are missing allergens that were not identified during your allergy testing. Other reasons could be that there are high levels of the allergen in your environment or major exposure to non-allergic triggers like tobacco smoke.  What Is the Length of Treatment?  Once the maintenance dose is reached, allergy shots are generally continued for three to five years. The decision to stop should be discussed with your allergist at that time. Some people may experience a permanent reduction of allergy symptoms. Others may relapse and a longer course of allergy shots can be considered.  What Are the Possible Reactions?  The two types of adverse reactions that can occur with allergy shots are local and systemic. Common local reactions include very mild redness and swelling at the injection site, which can happen immediately or several hours after. A systemic reaction, which is less common, affects the entire body or a particular body system. They are usually mild and typically respond quickly to medications. Signs include increased allergy symptoms such as sneezing, a stuffy nose or hives.  Rarely, a serious systemic reaction called anaphylaxis can develop. Symptoms  include swelling in the throat, wheezing, a feeling of tightness in the chest, nausea or dizziness. Most serious systemic reactions develop within 30 minutes of allergy shots. This is why it is strongly recommended you wait in your doctor's office for 30 minutes after your injections. Your allergist is trained to watch for reactions, and his or her staff is trained and equipped with the proper medications to identify and treat them.  Who Should Administer Allergy  Shots?  The preferred location for receiving shots is your prescribing allergist's office. Injections can sometimes be given at another facility where the physician and staff are trained to recognize and treat reactions, and have received instructions by your prescribing allergist.

## 2022-02-23 ENCOUNTER — Encounter (HOSPITAL_BASED_OUTPATIENT_CLINIC_OR_DEPARTMENT_OTHER): Payer: Self-pay

## 2022-02-23 DIAGNOSIS — R0681 Apnea, not elsewhere classified: Secondary | ICD-10-CM

## 2022-02-23 DIAGNOSIS — R400 Somnolence: Secondary | ICD-10-CM

## 2022-02-23 DIAGNOSIS — R0683 Snoring: Secondary | ICD-10-CM

## 2022-04-06 ENCOUNTER — Ambulatory Visit: Payer: BC Managed Care – PPO | Attending: Physician Assistant | Admitting: Neurology

## 2022-04-06 DIAGNOSIS — R400 Somnolence: Secondary | ICD-10-CM | POA: Diagnosis not present

## 2022-04-06 DIAGNOSIS — G4733 Obstructive sleep apnea (adult) (pediatric): Secondary | ICD-10-CM | POA: Diagnosis not present

## 2022-04-06 DIAGNOSIS — R0683 Snoring: Secondary | ICD-10-CM | POA: Diagnosis present

## 2022-04-06 DIAGNOSIS — R0681 Apnea, not elsewhere classified: Secondary | ICD-10-CM

## 2022-04-16 NOTE — Procedures (Signed)
   [place] A. Merlene Laughter, MD     [email or web address]               HOME SLEEP STUDY  LOCATION: [facility]   Patient Name: Jennifer Kane, Jennifer Kane Date: 04/06/2022 Gender: Female D.O.B: [birth year] Age (years): 46 Referring Provider: Clemmie Krill PA-C Height (inches): 25 Interpreting Physician: Phillips Odor MD, ABSM Weight (lbs): 240 RPSGT: Rosebud Poles BMI: 41 MRN: [id number] Neck Size: <br> <br> <br> CLINICAL INFORMATION Sleep Study Type: HST    Indication for sleep study: N/A    Epworth Sleepiness Score: N/A  SLEEP STUDY TECHNIQUE A multi-channel overnight portable sleep study was performed. The channels recorded were: nasal airflow, thoracic respiratory movement, and oxygen saturation with a pulse oximetry. Snoring was also monitored.  MEDICATIONS Patient self administered medications include: N/A.  Current Outpatient Medications:    budesonide-formoterol (SYMBICORT) 160-4.5 MCG/ACT inhaler, Inhale 2 puffs into the lungs 2 (two) times daily as needed., Disp: 1 each, Rfl: 5   EPINEPHrine (EPIPEN 2-PAK) 0.3 mg/0.3 mL IJ SOAJ injection, Inject 0.3 mg into the muscle once. (Patient not taking: Reported on 01/31/2022), Disp: , Rfl:    escitalopram (LEXAPRO) 10 MG tablet, Take 10 mg by mouth daily., Disp: , Rfl:    ibuprofen (ADVIL,MOTRIN) 800 MG tablet, Take 1 tablet (800 mg total) by mouth every 8 (eight) hours as needed., Disp: 30 tablet, Rfl: 0   levalbuterol (XOPENEX HFA) 45 MCG/ACT inhaler, Inhale 2 puffs into the lungs every 6 (six) hours as needed for wheezing., Disp: 1 each, Rfl: 2   Olopatadine-Mometasone (RYALTRIS) 665-25 MCG/ACT SUSP, Place 1 spray into the nose 2 (two) times daily as needed., Disp: 29 g, Rfl: 5   promethazine (PHENERGAN) 25 MG tablet, Take 25 mg by mouth every 8 (eight) hours as needed., Disp: , Rfl:    rizatriptan (MAXALT) 10 MG tablet, Take 10 mg by mouth as directed., Disp: , Rfl:    SLEEP  ARCHITECTURE Patient was studied for 482.4 minutes. The sleep efficiency was 80.4 % and the patient was supine for 0%. The arousal index was 0.0 per hour.  RESPIRATORY PARAMETERS The overall AHI was 6.6 per hour, with a central apnea index of 0 per hour.  The oxygen nadir was 81% during sleep.    CARDIAC DATA Mean heart rate during sleep was 60.5 bpm.  IMPRESSIONS - Mild obstructive sleep apnea occurred during this study (AHI = 6.6/h). This severity does not requre positive pressure treatment.       Delano Metz, MD Diplomate, American Board of Sleep Medicine.   ELECTRONICALLY SIGNED ON:  04/16/2022, 9:04 AM [facility] PH: [phone]   FX: [phone] Kevin

## 2022-07-17 ENCOUNTER — Other Ambulatory Visit: Payer: Self-pay | Admitting: Physician Assistant

## 2022-07-17 DIAGNOSIS — N644 Mastodynia: Secondary | ICD-10-CM

## 2022-07-26 ENCOUNTER — Ambulatory Visit
Admission: RE | Admit: 2022-07-26 | Discharge: 2022-07-26 | Disposition: A | Discharge status: Home / Self Care | Payer: BC Managed Care – PPO | Source: Ambulatory Visit | Attending: Physician Assistant | Admitting: Physician Assistant

## 2022-07-26 ENCOUNTER — Encounter: Payer: Self-pay | Admitting: Physician Assistant

## 2022-07-26 DIAGNOSIS — N644 Mastodynia: Secondary | ICD-10-CM

## 2022-07-26 DIAGNOSIS — N6310 Unspecified lump in the right breast, unspecified quadrant: Secondary | ICD-10-CM

## 2022-07-27 ENCOUNTER — Other Ambulatory Visit: Payer: Self-pay | Admitting: Physician Assistant

## 2022-07-27 DIAGNOSIS — R928 Other abnormal and inconclusive findings on diagnostic imaging of breast: Secondary | ICD-10-CM

## 2022-08-07 ENCOUNTER — Ambulatory Visit
Admission: RE | Admit: 2022-08-07 | Discharge: 2022-08-07 | Disposition: A | Discharge status: Home / Self Care | Payer: BC Managed Care – PPO | Source: Ambulatory Visit | Attending: Physician Assistant | Admitting: Physician Assistant

## 2022-08-07 DIAGNOSIS — R928 Other abnormal and inconclusive findings on diagnostic imaging of breast: Secondary | ICD-10-CM

## 2022-08-07 DIAGNOSIS — C50919 Malignant neoplasm of unspecified site of unspecified female breast: Secondary | ICD-10-CM

## 2022-08-07 HISTORY — DX: Malignant neoplasm of unspecified site of unspecified female breast: C50.919

## 2022-08-07 HISTORY — PX: BREAST BIOPSY: SHX20

## 2022-08-20 ENCOUNTER — Other Ambulatory Visit: Payer: Self-pay | Admitting: Surgery

## 2022-08-20 DIAGNOSIS — Z853 Personal history of malignant neoplasm of breast: Secondary | ICD-10-CM

## 2022-08-21 ENCOUNTER — Telehealth: Payer: Self-pay | Admitting: Hematology and Oncology

## 2022-08-21 NOTE — Telephone Encounter (Signed)
scheduled per 4/9 referral , pt has been called and confirmed date and time. Pt is aware of location and to arrive early for check in   

## 2022-08-23 ENCOUNTER — Ambulatory Visit
Admission: RE | Admit: 2022-08-23 | Discharge: 2022-08-23 | Disposition: A | Discharge status: Home / Self Care | Payer: BC Managed Care – PPO | Source: Ambulatory Visit | Attending: Radiation Oncology | Admitting: Radiation Oncology

## 2022-08-23 ENCOUNTER — Encounter: Payer: Self-pay | Admitting: Radiation Oncology

## 2022-08-23 ENCOUNTER — Other Ambulatory Visit: Payer: Self-pay | Admitting: Surgery

## 2022-08-23 DIAGNOSIS — Z853 Personal history of malignant neoplasm of breast: Secondary | ICD-10-CM

## 2022-08-23 DIAGNOSIS — Z170 Estrogen receptor positive status [ER+]: Secondary | ICD-10-CM

## 2022-08-23 HISTORY — DX: Type 2 diabetes mellitus without complications: E11.9

## 2022-08-23 NOTE — Progress Notes (Signed)
New Breast Cancer Diagnosis: Right Breast  Did patient present with symptoms (if so, please note symptoms) or screening mammography?: Patient felt abnormality on the lower right breast.   Location and Extent of disease :right breast. Located at 12:30 position, measured 1.1 cm in greatest dimension. Adenopathy no.  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 Magnus Ivan -Right Breast Lumpectomy with radioactive seed and SLN biopsy 09/06/2022   Medical oncologist, treatment if any:   Dr. Pamelia Hoit 09/04/2022  Family History of Breast/Ovarian/Prostate Cancer: Maternal Aunt had Ovarian Cancer, Mom had breast Cancer, Maternal first cousin had Breast Cancer, Paternal Aunt had breast Cancer  Lymphedema issues, if any: None     Pain issues, if any: None    SAFETY ISSUES: Prior radiation? No Pacemaker/ICD? No Possible current pregnancy? Having Cycles- getting lighter Is the patient on methotrexate? No  Current Complaints / other details:

## 2022-08-24 ENCOUNTER — Telehealth: Payer: Self-pay | Admitting: Hematology and Oncology

## 2022-08-24 ENCOUNTER — Other Ambulatory Visit: Payer: Self-pay | Admitting: Radiation Oncology

## 2022-08-24 DIAGNOSIS — Z170 Estrogen receptor positive status [ER+]: Secondary | ICD-10-CM | POA: Insufficient documentation

## 2022-08-24 NOTE — Telephone Encounter (Signed)
Reached out to patient to schedule per 4/11 IB, patient aware of date and time.

## 2022-08-24 NOTE — Progress Notes (Signed)
Radiation Oncology         [phone] ________________________________  Initial Outpatient Consultation - Conducted via telephone at patient request.  I spoke with the patient to conduct this consult visit via telephone. The patient was notified in advance and was offered an in person or telemedicine meeting to allow for face to face communication but instead preferred to proceed with a telephone consult.   ________________________________  Name: Jennifer Kane        MRN: [id number]  Date of Service: 08/23/2022 DOB: Mar 07, 1976  [id number], Adair Laundry, MD     REFERRING PHYSICIAN: Abigail Miyamoto, MD   DIAGNOSIS: The encounter diagnosis was Malignant neoplasm of upper-inner quadrant of right breast in female, estrogen receptor positive.   HISTORY OF PRESENT ILLNESS: Jennifer Kane is a 47 y.o. female seen at the request of Dr. Magnus Ivan for a new diagnosis of right breast cancer.  The patient palpated an abnormality in her right breast and was seen for diagnostic imaging that identified a mass in the lower right breast.  By ultrasound this was located in the 12:30 position and measured 1.1 cm in greatest dimension.  There is no adenopathy noted in the axilla.  She underwent a biopsy on 08/07/2022 that showed a grade 2 invasive ductal carcinoma that was ER/PR positive.  HER2 was negative and Ki-67 was 10%.  She is planning on right lumpectomy with sentinel lymph node biopsy on 09/06/2022 with Dr. Magnus Ivan, and will meet with Dr. Pamelia Hoit a few days prior.  She is contacted by phone today to discuss treatment recommendations of her cancer.  Initially our appointment was scheduled to be MyChart however because clinic was running behind and the link expired.  The patient was happy to continue our encounter as a telephone visit.    PREVIOUS RADIATION THERAPY: No   PAST MEDICAL HISTORY:  Past Medical History:  Diagnosis Date   Anxiety    Asthma    Breast cancer  08/07/2022   Diabetes    Dysrhythmia    Hx of Irregular Heartbeat   GERD (gastroesophageal reflux disease)    History of panic attacks    History of tachycardia    Details not clear, reported abnormal tilt table test at age 20, on chronic low-dose beta-blocker       PAST SURGICAL HISTORY: Past Surgical History:  Procedure Laterality Date   BREAST BIOPSY Right 08/07/2022   [place] RT BREAST BX W LOC DEV 1ST LESION IMG BX SPEC US GUIDE 08/07/2022 [facility]   CHOLECYSTECTOMY N/A 05/01/2018   Procedure: LAPAROSCOPIC CHOLECYSTECTOMY;  Surgeon: Rodman Pickle, MD;  Location: [facility];  Service: General;  Laterality: N/A;   KNEE ARTHROSCOPY  1989   No previous surgeries     WISDOM TOOTH EXTRACTION       FAMILY HISTORY:  Family History  Problem Relation Age of Onset   Breast cancer Mother        23s   CAD Father    Breast cancer Paternal Aunt    Breast cancer Cousin        Maternal 1st Cousin   Breast cancer Maternal Aunt      SOCIAL HISTORY:  reports that she has never smoked. She has never used smokeless tobacco. She reports that she does not drink alcohol and does not use drugs.  The patient is married and lives in [place].  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   ALLERGIES: Meat [  alpha-gal]   MEDICATIONS:  Current Outpatient Medications  Medication Sig Dispense Refill   ALPRAZolam (XANAX) 0.25 MG tablet Take by mouth.     escitalopram (LEXAPRO) 10 MG tablet Take 10 mg by mouth daily.     ibuprofen (ADVIL,MOTRIN) 800 MG tablet Take 1 tablet (800 mg total) by mouth every 8 (eight) hours as needed. 30 tablet 0   EPINEPHrine (EPIPEN 2-PAK) 0.3 mg/0.3 mL IJ SOAJ injection Inject 0.3 mg into the muscle once. (Patient not taking: Reported on 01/31/2022)     OZEMPIC, 0.25 OR 0.5 MG/DOSE, 2 MG/3ML SOPN Inject 0.5 mg into the skin once a week. (Patient not taking: Reported on 08/23/2022)     No current facility-administered  medications for this encounter.     REVIEW OF SYSTEMS: On review of systems, the patient reports that she is doing well.  She is not having any pain or concerns since her biopsy was performed.     PHYSICAL EXAM:  Unable to assess due to encounter type    ECOG = 0  0 - Asymptomatic (Fully active, able to carry on all predisease activities without restriction)  1 - Symptomatic but completely ambulatory (Restricted in physically strenuous activity but ambulatory and able to carry out work of a light or sedentary nature. For example, light housework, office work)  2 - Symptomatic, <50% in bed during the day (Ambulatory and capable of all self care but unable to carry out any work activities. Up and about more than 50% of waking hours)  3 - Symptomatic, >50% in bed, but not bedbound (Capable of only limited self-care, confined to bed or chair 50% or more of waking hours)  4 - Bedbound (Completely disabled. Cannot carry on any self-care. Totally confined to bed or chair)  5 - Death   Santiago Glad MM, Creech RH, Tormey DC, et al. [id number]). "Toxicity and response criteria of the [facility]". Am. Evlyn Clines. Oncol. 5 (6): 649-55    LABORATORY DATA:  Lab Results  Component Value Date   WBC 5.7 04/28/2018   HGB 13.1 04/28/2018   HCT 41.1 04/28/2018   MCV 96.0 04/28/2018   PLT 310 04/28/2018   Lab Results  Component Value Date   NA 139 04/28/2018   K 4.1 04/28/2018   CL 107 04/28/2018   CO2 25 04/28/2018   No results found for: "ALT", "AST", "GGT", "ALKPHOS", "BILITOT"    RADIOGRAPHY: [place] RT BREAST BX W LOC DEV 1ST LESION IMG BX SPEC US GUIDE  Addendum Date: 08/09/2022   ADDENDUM REPORT: 08/09/2022 19:05 ADDENDUM: Pathology revealed GRADE II INVASIVE DUCTAL CARCINOMA of RIGHT breast, 12:30 o'clock, (ribbon clip). This was found to be concordant by Dr. Annia Belt. Pathology results were discussed with the patient by telephone by Collene Mares, RN Nurse Navigator. The  patient reported doing well after the biopsy with tenderness at the site. Post biopsy instructions and care were reviewed and questions were answered. The patient was encouraged to call [facility] for any additional concerns. Surgical consultation has been arranged with Dr. Carman Ching at [facility] on August 20, 2022. Pathology results reported by Rene Kocher, RN on 08/09/2022. Electronically Signed   By: Annia Belt M.D.   On: 08/09/2022 19:05   Result Date: 08/09/2022 CLINICAL DATA:  Right breast mass EXAM: ULTRASOUND GUIDED RIGHT BREAST CORE NEEDLE BIOPSY COMPARISON:  Previous exam(s). PROCEDURE: I met with the patient and we discussed the procedure of ultrasound-guided biopsy, including benefits  and alternatives. We discussed the high likelihood of a successful procedure. We discussed the risks of the procedure, including infection, bleeding, tissue injury, clip migration, and inadequate sampling. Informed written consent was given. The usual time-out protocol was performed immediately prior to the procedure. Lesion quadrant: Upper inner quadrant Using sterile technique and 1% Lidocaine as local anesthetic, under direct ultrasound visualization, a 14 gauge spring-loaded device was used to perform biopsy of right breast mass 12:30 o'clock using a lateral approach. At the conclusion of the procedure ribbon shaped tissue marker clip was deployed into the biopsy cavity. Follow up 2 view mammogram was performed and dictated separately. IMPRESSION: Ultrasound guided biopsy of right breast mass 12:30 o'clock. No apparent complications. Electronically Signed: By: Annia Belt M.D. On: 08/07/2022 10:30  MM CLIP PLACEMENT RIGHT  Result Date: 08/07/2022 CLINICAL DATA:  Status post ultrasound biopsy right breast mass. EXAM: 3D DIAGNOSTIC RIGHT MAMMOGRAM POST ULTRASOUND BIOPSY COMPARISON:  Previous exam(s). FINDINGS: 3D Mammographic images were obtained following ultrasound  guided biopsy of right breast mass. The biopsy marking clip is in expected position at the site of biopsy. IMPRESSION: Appropriate positioning of the ribbon shaped biopsy marking clip at the site of biopsy in the upper inner right breast. Final Assessment: Post Procedure Mammograms for Marker Placement Electronically Signed   By: Annia Belt M.D.   On: 08/07/2022 10:34  MM 3D DIAGNOSTIC MAMMOGRAM BILATERAL BREAST  Result Date: 07/26/2022 CLINICAL DATA:  Palpable abnormality in the RIGHT breast. EXAM: DIGITAL DIAGNOSTIC BILATERAL MAMMOGRAM WITH TOMOSYNTHESIS; ULTRASOUND RIGHT BREAST LIMITED TECHNIQUE: Bilateral digital diagnostic mammography and breast tomosynthesis was performed.; Targeted ultrasound examination of the right breast was performed COMPARISON:  Previous exam(s). ACR Breast Density Category b: There are scattered areas of fibroglandular density. FINDINGS: LEFT breast is negative. Within the UPPER INNER QUADRANT of the RIGHT breast, there is a possible mass, confirmed on spot compression views. These views confirm a small mass with spiculated margins. Spot tangential view is performed in the area of patient's concern, in the 4 o'clock location and shows normal appearing fibroglandular tissue with a an adjacent 0.5 centimeter circumscribed oval mass. On physical exam, I palpate soft thickening in the 12 30-1 o'clock location of the RIGHT breast. I palpate no discrete mass in the LOWER INNER QUADRANT of the RIGHT breast. Targeted ultrasound is performed, showing an irregular mass with spiculated margins in the 12:30 o'clock location of the RIGHT breast 7 centimeters from the nipple measuring 1.1 x 0.5 x 0.8 centimeters. Internal blood flow is present on Doppler evaluation. In the area of palpable abnormality, normal fibrofatty tissue is identified. Within this region there is a small cyst in the 4 o'clock location 5 centimeters from the nipple, measuring 6 millimeters. Evaluation of the RIGHT axilla is  negative for adenopathy. IMPRESSION: 1. Suspicious mass in the 12:30 o'clock location of the RIGHT breast. 2. No evidence for malignancy in the area concern in the LOWER INNER QUADRANT. 3. No RIGHT axillary adenopathy. RECOMMENDATION: Ultrasound-guided core biopsy of mass in the 12:30 o'clock location of RIGHT breast. I have discussed the findings and recommendations with the patient. If applicable, a reminder letter will be sent to the patient regarding the next appointment. BI-RADS CATEGORY  4: Suspicious. Electronically Signed   By: Norva Pavlov M.D.   On: 07/26/2022 15:46  [place] LIMITED ULTRASOUND INCLUDING AXILLA RIGHT BREAST  Result Date: 07/26/2022 CLINICAL DATA:  Palpable abnormality in the RIGHT breast. EXAM: DIGITAL DIAGNOSTIC BILATERAL MAMMOGRAM WITH TOMOSYNTHESIS; ULTRASOUND RIGHT BREAST LIMITED TECHNIQUE:  Bilateral digital diagnostic mammography and breast tomosynthesis was performed.; Targeted ultrasound examination of the right breast was performed COMPARISON:  Previous exam(s). ACR Breast Density Category b: There are scattered areas of fibroglandular density. FINDINGS: LEFT breast is negative. Within the UPPER INNER QUADRANT of the RIGHT breast, there is a possible mass, confirmed on spot compression views. These views confirm a small mass with spiculated margins. Spot tangential view is performed in the area of patient's concern, in the 4 o'clock location and shows normal appearing fibroglandular tissue with a an adjacent 0.5 centimeter circumscribed oval mass. On physical exam, I palpate soft thickening in the 12 30-1 o'clock location of the RIGHT breast. I palpate no discrete mass in the LOWER INNER QUADRANT of the RIGHT breast. Targeted ultrasound is performed, showing an irregular mass with spiculated margins in the 12:30 o'clock location of the RIGHT breast 7 centimeters from the nipple measuring 1.1 x 0.5 x 0.8 centimeters. Internal blood flow is present on Doppler evaluation. In the  area of palpable abnormality, normal fibrofatty tissue is identified. Within this region there is a small cyst in the 4 o'clock location 5 centimeters from the nipple, measuring 6 millimeters. Evaluation of the RIGHT axilla is negative for adenopathy. IMPRESSION: 1. Suspicious mass in the 12:30 o'clock location of the RIGHT breast. 2. No evidence for malignancy in the area concern in the LOWER INNER QUADRANT. 3. No RIGHT axillary adenopathy. RECOMMENDATION: Ultrasound-guided core biopsy of mass in the 12:30 o'clock location of RIGHT breast. I have discussed the findings and recommendations with the patient. If applicable, a reminder letter will be sent to the patient regarding the next appointment. BI-RADS CATEGORY  4: Suspicious. Electronically Signed   By: Norva Pavlov M.D.   On: 07/26/2022 15:46      IMPRESSION/PLAN: 1. Stage IA, [id number], grade 2 ER/PR positive invasive ductal carcinoma of the right breast. Dr. Mitzi Hansen discusses the pathology findings and reviews the nature of early stage breast disease. She is scheduled for breast conservation with lumpectomy with  sentinel node biopsy. Depending on the size of the final tumor measurements rendered by pathology, the tumor may be tested for Oncotype Dx score to determine a role for systemic therapy. Provided that chemotherapy is not indicated, the patient's course would then be followed by external radiotherapy to the breast  to reduce risks of local recurrence followed by antiestrogen therapy. We discussed the risks, benefits, short, and long term effects of radiotherapy, as well as the curative intent, and the patient is interested in proceeding. Dr. Mitzi Hansen discusses the delivery and logistics of radiotherapy and anticipates a course of 4 or up to 6 1/2 weeks of radiotherapy to the right breast. We will see her back a few weeks after surgery to discuss the simulation process and anticipate we starting radiotherapy about 4-6 weeks after surgery.   2. Possible genetic predisposition to malignancy. The patient is a candidate for genetic testing given her personal and family history. She will be referred to meet with genetics.   This encounter was conducted via telephone.  The patient has provided two factor identification and has given verbal consent for this type of encounter and has been advised to only accept a meeting of this type in a secure network environment. The time spent during this encounter was 45 minutes including preparation, discussion, and coordination of the patient's care. The attendants for this meeting include Rober Minion, RN, Dr. Mitzi Hansen, Ronny Bacon  and Allie Bossier.  During the encounter,  Rober Minion, RN, Dr. Mitzi Hansen, and Ronny Bacon were located at [facility] Radiation Oncology Department.  Jennifer Kane was located at home.   The above documentation reflects my direct findings during this shared patient visit. Please see the separate note by Dr. Mitzi Hansen on this date for the remainder of the patient's plan of care.    Osker Mason, [facility]    **Disclaimer: This note was dictated with voice recognition software. Similar sounding words can inadvertently be transcribed and this note may contain transcription errors which may not have been corrected upon publication of note.**

## 2022-08-27 NOTE — Therapy (Signed)
OUTPATIENT PHYSICAL THERAPY BREAST CANCER BASELINE EVALUATION   Patient Name: Jennifer Kane MRN: [id number] DOB:1976/01/15, 47 y.o., female Today's Date: 08/28/2022  END OF SESSION:  PT End of Session - 08/28/22 4098     Visit Number 1    Number of Visits 2    Date for PT Re-Evaluation 10/09/22    PT Start Time 0802    PT Stop Time 0830    PT Time Calculation (min) 28 min    Activity Tolerance Patient tolerated treatment well    Behavior During Therapy [facility] for tasks assessed/performed             Past Medical History:  Diagnosis Date   Anxiety    Asthma    Breast cancer 08/07/2022   Diabetes    Dysrhythmia    Hx of Irregular Heartbeat   GERD (gastroesophageal reflux disease)    History of panic attacks    History of tachycardia    Details not clear, reported abnormal tilt table test at age 79, on chronic low-dose beta-blocker   Past Surgical History:  Procedure Laterality Date   BREAST BIOPSY Right 08/07/2022   [place] RT BREAST BX W LOC DEV 1ST LESION IMG BX SPEC US GUIDE 08/07/2022 [facility] MAMMOGRAPHY   CHOLECYSTECTOMY N/A 05/01/2018   Procedure: LAPAROSCOPIC CHOLECYSTECTOMY;  Surgeon: Rodman Pickle, MD;  Location: [facility];  Service: General;  Laterality: N/A;   KNEE ARTHROSCOPY  1989   No previous surgeries     WISDOM TOOTH EXTRACTION     Patient Active Problem List   Diagnosis Date Noted   Malignant neoplasm of upper-inner quadrant of right breast in female, estrogen receptor positive 08/24/2022   Moderate persistent asthma, uncomplicated 01/31/2022   Seasonal and perennial allergic rhinitis 01/31/2022   Anaphylactic shock due to adverse food reaction 01/31/2022    PCP: Wayland Denis, PA-C  REFERRING PROVIDER: Dr. Magnus Ivan  REFERRING DIAG: C50.911 (ICD-10-CM) - Malignant neoplasm of unspecified site of right female breast   THERAPY DIAG:  Malignant neoplasm of upper-inner quadrant of right breast in female, estrogen receptor positive - Plan:  PT plan of care cert/re-cert  Abnormal posture - Plan: PT plan of care cert/re-cert  Rationale for Evaluation and Treatment: Rehabilitation  ONSET DATE: 08/13/22  SUBJECTIVE:                                                                                                                                                                                           SUBJECTIVE STATEMENT: Patient reports she is here today to be seen by her medical team for her newly diagnosed right breast cancer.   PERTINENT  HISTORY:  Patient was diagnosed with right grade 2 IDC. It measures 1.1 cm. It is ER/PR positive with a Ki67 of 10%. Will be having Rt breast lumpectomy and SLNB on 09/06/22.  Other hx includes asthma, DM. Height: 5'3"  PATIENT GOALS:   reduce lymphedema risk and learn post op HEP.   PAIN:  Are you having pain? No  PRECAUTIONS: Active CA  HAND DOMINANCE: right  WEIGHT BEARING RESTRICTIONS: No  FALLS:  Has patient fallen in last 6 months? No  LIVING ENVIRONMENT: Patient lives with: husband  OCCUPATION: teach kindergarten    LEISURE: nothing now  PRIOR LEVEL OF FUNCTION: Independent   OBJECTIVE:  COGNITION: Overall cognitive status: Within functional limits for tasks assessed    POSTURE:  Forward head and rounded shoulders posture  UPPER EXTREMITY AROM/PROM:  A/PROM RIGHT   eval   Shoulder extension 45  Shoulder flexion 170  Shoulder abduction 165  Shoulder internal rotation   Shoulder external rotation 90    (Blank rows = not tested)  A/PROM LEFT   eval  Shoulder extension 47  Shoulder flexion 160  Shoulder abduction 165  Shoulder internal rotation   Shoulder external rotation 95    (Blank rows = not tested)  CERVICAL AROM: All within normal limits:   UPPER EXTREMITY STRENGTH: 5/5   LYMPHEDEMA ASSESSMENTS:   LANDMARK RIGHT   eval  10 cm proximal to olecranon process 38  Olecranon process 31  10 cm proximal to ulnar styloid process 27.5  Just  proximal to ulnar styloid process 18.5  Across hand at thumb web space 20.5  At base of 2nd digit 7.5  (Blank rows = not tested)  LANDMARK LEFT   eval  10 cm proximal to olecranon process 35.5  Olecranon process 30  10 cm proximal to ulnar styloid process 26  Just proximal to ulnar styloid process 19  Across hand at thumb web space 21  At base of 2nd digit 7.2  (Blank rows = not tested)  L-DEX LYMPHEDEMA SCREENING: The patient was assessed using the L-Dex machine today to produce a lymphedema index baseline score. The patient will be reassessed on a regular basis (typically every 3 months) to obtain new L-Dex scores. If the score is > 6.5 points away from his/her baseline score indicating onset of subclinical lymphedema, it will be recommended to wear a compression garment for 4 weeks, 12 hours per day and then be reassessed. If the score continues to be > 6.5 points from baseline at reassessment, we will initiate lymphedema treatment. Assessing in this manner has a 95% rate of preventing clinically significant lymphedema.  QUICK DASH SURVEY: 2.66%  PATIENT EDUCATION:  Education details: Lymphedema risk reduction and post op shoulder/posture HEP Person educated: Patient Education method: Explanation, Demonstration, Handout Education comprehension: Patient verbalized understanding and returned demonstration  HOME EXERCISE PROGRAM: Patient was instructed today in a home exercise program today for post op shoulder range of motion. These included active assist shoulder flexion in sitting, scapular retraction, wall walking with shoulder abduction, and hands behind head external rotation.  She was encouraged to do these twice a day, holding 3 seconds and repeating 5 times when permitted by her physician.   ASSESSMENT:  CLINICAL IMPRESSION: Pt will benefit from a post op PT reassessment to determine needs and from L-Dex screens every 3 months for 2 years to detect subclinical lymphedema.  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    Pt will benefit from skilled therapeutic intervention to improve on the following deficits: Decreased knowledge of precautions, impaired UE functional use, pain, decreased ROM, postural dysfunction.   PT treatment/interventions: ADL/self-care home management, pt/family education, therapeutic exercise  REHAB POTENTIAL: Excellent  CLINICAL DECISION MAKING: Stable/uncomplicated  EVALUATION COMPLEXITY: Low   GOALS: Goals reviewed with patient? YES  LONG TERM GOALS: (STG=LTG)    Name Target Date Goal status  1 Pt will be able to verbalize understanding of pertinent lymphedema risk reduction practices relevant to her dx specifically related to skin care.  Baseline:  No knowledge 08/28/2022 Achieved at eval  2 Pt will be able to return demo and/or verbalize understanding of the post op HEP related to regaining shoulder ROM. Baseline:  No knowledge 08/28/2022 Achieved at eval  3 Pt will be able to verbalize understanding of the importance of attending the post op After Breast CA Class for further lymphedema risk reduction education and therapeutic exercise.  Baseline:  No knowledge 08/28/2022 Achieved at eval  4 Pt will demo she has regained full shoulder ROM and function post operatively compared to baselines.  Baseline: See objective measurements taken today. 10/09/22     PLAN:  PT FREQUENCY/DURATION: EVAL and 1 follow up appointment.   PLAN FOR NEXT SESSION: will reassess 3-4 weeks post op to determine needs.   Patient will follow up at outpatient cancer rehab 3-4 weeks following surgery.  If the patient requires physical therapy at that time, a specific plan will be dictated and sent to the referring physician for approval. The patient was educated today on appropriate basic range of motion exercises to begin post operatively and the  importance of attending the After Breast Cancer class following surgery.  Patient was educated today on lymphedema risk reduction practices as it pertains to recommendations that will benefit the patient immediately following surgery.  She verbalized good understanding.    Physical Therapy Information for After Breast Cancer Surgery/Treatment:  Lymphedema is a swelling condition that you may be at risk for in your arm if you have lymph nodes removed from the armpit area.  After a sentinel node biopsy, the risk is approximately 5-9% and is higher after an axillary node dissection.  There is treatment available for this condition and it is not life-threatening.  Contact your physician or physical therapist with concerns. You may begin the 4 shoulder/posture exercises (see additional sheet) when permitted by your physician (typically a week after surgery).  If you have drains, you may need to wait until those are removed before beginning range of motion exercises.  A general recommendation is to not lift your arms above shoulder height until drains are removed.  These exercises should be done to your tolerance and gently.  This is not a "no pain/no gain" type of recovery so listen to your body and stretch into the range of motion that you can tolerate, stopping if you have pain.  If you are having immediate reconstruction, ask your plastic surgeon about doing exercises as he or she may want you to wait. We encourage you to attend the free one time ABC (After Breast Cancer) class offered by [facility].  You will learn information related to lymphedema risk, prevention and treatment and additional exercises to regain mobility following surgery.  You can call [phone] for more information.  This is offered the 1st and 3rd Monday of each month.  You only  attend the class one time. While undergoing any medical procedure or treatment, try to avoid blood pressure being taken or needle  sticks from occurring on the arm on the side of cancer.   This recommendation begins after surgery and continues for the rest of your life.  This may help reduce your risk of getting lymphedema (swelling in your arm). An excellent resource for those seeking information on lymphedema is the National Lymphedema Network's web site. It can be accessed at www.lymphnet.org If you notice swelling in your hand, arm or breast at any time following surgery (even if it is many years from now), please contact your doctor or physical therapist to discuss this.  Lymphedema can be treated at any time but it is easier for you if it is treated early on.  If you feel like your shoulder motion is not returning to normal in a reasonable amount of time, please contact your surgeon or physical therapist.  [facility] [phone]. [address], [address], [place] [address]  ABC CLASS After Breast Cancer Class  After Breast Cancer Class is a specially designed exercise class to assist you in a safe recover after having breast cancer surgery.  In this class you will learn how to get back to full function whether your drains were just removed or if you had surgery a month ago.  This one-time class is held the 1st and 3rd Monday of every month from 11:00 a.m. until 12:00 noon virtually.  This class is FREE and space is limited. For more information or to register for the next available class, call [phone].  Class Goals  Understand specific stretches to improve the flexibility of you chest and shoulder. Learn ways to safely strengthen your upper body and improve your posture. Understand the warning signs of infection and why you may be at risk for an arm infection. Learn about Lymphedema and prevention.  ** You do not attend this class until after surgery.  Drains must be removed to participate  Patient was instructed today in a home exercise program today for post op  shoulder range of motion. These included active assist shoulder flexion in sitting, scapular retraction, wall walking with shoulder abduction, and hands behind head external rotation.  She was encouraged to do these twice a day, holding 3 seconds and repeating 5 times when permitted by her physician.    Idamae Lusher, PT 08/28/2022, 8:33 AM

## 2022-08-28 ENCOUNTER — Encounter: Payer: Self-pay | Admitting: Rehabilitation

## 2022-08-28 ENCOUNTER — Ambulatory Visit: Payer: BC Managed Care – PPO | Attending: Surgery | Admitting: Rehabilitation

## 2022-08-28 ENCOUNTER — Other Ambulatory Visit: Payer: Self-pay

## 2022-08-28 DIAGNOSIS — R293 Abnormal posture: Secondary | ICD-10-CM | POA: Diagnosis present

## 2022-08-28 DIAGNOSIS — C50211 Malignant neoplasm of upper-inner quadrant of right female breast: Secondary | ICD-10-CM | POA: Diagnosis present

## 2022-08-28 DIAGNOSIS — Z170 Estrogen receptor positive status [ER+]: Secondary | ICD-10-CM | POA: Diagnosis present

## 2022-08-29 NOTE — Progress Notes (Signed)
Surgical Instructions    Your procedure is scheduled on Thursday April 25th.  Report to [facility] Main Entrance "A" at 5:30 A.M., then check in with the Admitting office.  Call this number if you have problems the morning of surgery:  [phone]   If you have any questions prior to your surgery date call [phone]: Open Monday-Friday 8am-4pm If you experience any cold or flu symptoms such as cough, fever, chills, shortness of breath, etc. between now and your scheduled surgery, please notify us at the above number     Remember:  Do not eat after midnight the night before your surgery  You may drink clear liquids until 530 am the morning of your surgery.   Clear liquids allowed are: Water, Non-Citrus Juices (without pulp), Carbonated Beverages, Clear Tea, Black Coffee ONLY (NO MILK, CREAM OR POWDERED CREAMER of any kind), and Gatorade   Enhanced Recovery after Surgery for Orthopedics Enhanced Recovery after Surgery is a protocol used to improve the stress on your body and your recovery after surgery.  Patient Instructions  The day of surgery (if you have diabetes):  Drink ONE G2 or small 10 oz bottle of water by _4:30____ am the morning of surgery This bottle was given to you during your hospital  pre-op appointment visit.  Nothing else to drink after completing the  Small 10 oz bottle of water.         If you have questions, please contact your surgeon's office.     Take these medicines the morning of surgery with A SIP OF WATER: IF NEEDED  EPINEPHrine (EPIPEN 2-PAK) 0.3 mg/0.3 mL IJ SOAJ injection     As of today, STOP taking any Aspirin (unless otherwise instructed by your surgeon) Aleve, Naproxen, Ibuprofen, Motrin, Advil, Goody's, BC's, all herbal medications, fish oil, and all vitamins.  WHAT DO I DO ABOUT MY DIABETES MEDICATION?   Do not take oral diabetes medicines (pills) the morning of surgery.  HOLD your OZEMPIC for 7 days prior to surgery   HOW TO  MANAGE YOUR DIABETES BEFORE AND AFTER SURGERY  Why is it important to control my blood sugar before and after surgery? Improving blood sugar levels before and after surgery helps healing and can limit problems. A way of improving blood sugar control is eating a healthy diet by:  Eating less sugar and carbohydrates  Increasing activity/exercise  Talking with your doctor about reaching your blood sugar goals High blood sugars (greater than 180 mg/dL) can raise your risk of infections and slow your recovery, so you will need to focus on controlling your diabetes during the weeks before surgery. Make sure that the doctor who takes care of your diabetes knows about your planned surgery including the date and location.  How do I manage my blood sugar before surgery? Check your blood sugar at least 4 times a day, starting 2 days before surgery, to make sure that the level is not too high or low.  Check your blood sugar the morning of your surgery when you wake up and every 2 hours until you get to the Short Stay unit.  If your blood sugar is less than 70 mg/dL, you will need to treat for low blood sugar: Do not take insulin. Treat a low blood sugar (less than 70 mg/dL) with  cup of clear juice (cranberry or apple), 4 glucose tablets, OR glucose gel. Recheck blood sugar in 15 minutes after treatment (to make sure it is greater than 70 mg/dL). If  your blood sugar is not greater than 70 mg/dL on recheck, call [phone] for further instructions. Report your blood sugar to the short stay nurse when you get to Short Stay.  If you are admitted to the hospital after surgery: Your blood sugar will be checked by the staff and you will probably be given insulin after surgery (instead of oral diabetes medicines) to make sure you have good blood sugar levels. The goal for blood sugar control after surgery is 80-180 mg/dL.  DAY OF SURGERY         Do not wear jewelry or makeup. Do not wear lotions,  powders, perfumes or deodorant. Do not shave 48 hours prior to surgery.   Do not bring valuables to the hospital. Do not wear nail polish, gel polish, artificial nails, or any other type of covering on natural nails (fingers and toes) If you have artificial nails or gel coating that need to be removed by a nail salon, please have this removed prior to surgery. Artificial nails or gel coating may interfere with anesthesia's ability to adequately monitor your vital signs.  [facility] is not responsible for any belongings or valuables.    Do NOT Smoke (Tobacco/Vaping)  24 hours prior to your procedure  If you use a CPAP at night, you may bring your mask for your overnight stay.   Contacts, glasses, hearing aids, dentures or partials may not be worn into surgery, please bring cases for these belongings   For patients admitted to the hospital, discharge time will be determined by your treatment team.   Patients discharged the day of surgery will not be allowed to drive home, and someone needs to stay with them for 24 hours.   SURGICAL WAITING ROOM VISITATION Patients having surgery or a procedure may have no more than 2 support people in the waiting area - these visitors may rotate.   Children under the age of 9 must have an adult with them who is not the patient. If the patient needs to stay at the hospital during part of their recovery, the visitor guidelines for inpatient rooms apply. Pre-op nurse will coordinate an appropriate time for 1 support person to accompany patient in pre-op.  This support person may not rotate.   Please refer to [email or web address] for the visitor guidelines for Inpatients (after your surgery is over and you are in a regular room).    Special instructions:    Oral Hygiene is also important to reduce your risk of infection.  Remember - BRUSH YOUR TEETH THE MORNING OF SURGERY WITH YOUR REGULAR  TOOTHPASTE   [facility]- Preparing For Surgery  Before surgery, you can play an important role. Because skin is not sterile, your skin needs to be as free of germs as possible. You can reduce the number of germs on your skin by washing with CHG (chlorahexidine gluconate) Soap before surgery.  CHG is an antiseptic cleaner which kills germs and bonds with the skin to continue killing germs even after washing.     Please do not use if you have an allergy to CHG or antibacterial soaps. If your skin becomes reddened/irritated stop using the CHG.  Do not shave (including legs and underarms) for at least 48 hours prior to first CHG shower. It is OK to shave your face.  Please follow these instructions carefully.     Shower the NIGHT BEFORE SURGERY and the MORNING OF SURGERY with CHG Soap.   If you chose to wash your hair,  wash your hair first as usual with your normal shampoo. After you shampoo, rinse your hair and body thoroughly to remove the shampoo.  Then [employer] and genitals (private parts) with your normal soap and rinse thoroughly to remove soap.  After that Use CHG Soap as you would any other liquid soap. You can apply CHG directly to the skin and wash gently with a scrungie or a clean washcloth.   Apply the CHG Soap to your body ONLY FROM THE NECK DOWN.  Do not use on open wounds or open sores. Avoid contact with your eyes, ears, mouth and genitals (private parts). Wash Face and genitals (private parts)  with your normal soap.   Wash thoroughly, paying special attention to the area where your surgery will be performed.  Thoroughly rinse your body with warm water from the neck down.  DO NOT shower/wash with your normal soap after using and rinsing off the CHG Soap.  Pat yourself dry with a CLEAN TOWEL.  Wear CLEAN PAJAMAS to bed the night before surgery  Place CLEAN SHEETS on your bed the night before your surgery  DO NOT SLEEP WITH PETS.   Day of Surgery:  Take a shower  with CHG soap. Wear Clean/Comfortable clothing the morning of surgery Do not apply any deodorants/lotions.   Remember to brush your teeth WITH YOUR REGULAR TOOTHPASTE.    If you received a COVID test during your pre-op visit, it is requested that you wear a mask when out in public, stay away from anyone that may not be feeling well, and notify your surgeon if you develop symptoms. If you have been in contact with anyone that has tested positive in the last 10 days, please notify your surgeon.    Please read over the following fact sheets that you were given.

## 2022-08-30 ENCOUNTER — Encounter (HOSPITAL_COMMUNITY): Payer: Self-pay

## 2022-08-30 ENCOUNTER — Encounter (HOSPITAL_COMMUNITY)
Admission: RE | Admit: 2022-08-30 | Discharge: 2022-08-30 | Disposition: A | Discharge status: Home / Self Care | Payer: BC Managed Care – PPO | Source: Ambulatory Visit | Attending: Surgery | Admitting: Surgery

## 2022-08-30 ENCOUNTER — Other Ambulatory Visit: Payer: Self-pay

## 2022-08-30 VITALS — BP 131/87 | HR 88 | Temp 97.7°F | Resp 16 | Ht 63.0 in | Wt 230.1 lb

## 2022-08-30 DIAGNOSIS — E119 Type 2 diabetes mellitus without complications: Secondary | ICD-10-CM | POA: Diagnosis not present

## 2022-08-30 DIAGNOSIS — Z01818 Encounter for other preprocedural examination: Secondary | ICD-10-CM

## 2022-08-30 HISTORY — DX: Other specified postprocedural states: Z98.890

## 2022-08-30 LAB — BASIC METABOLIC PANEL
Anion gap: 7 (ref 5–15)
BUN: 11 mg/dL (ref 6–20)
CO2: 26 mmol/L (ref 22–32)
Calcium: 8.7 mg/dL — ABNORMAL LOW (ref 8.9–10.3)
Chloride: 103 mmol/L (ref 98–111)
Creatinine, Ser: 0.78 mg/dL (ref 0.44–1.00)
GFR, Estimated: >60 mL/min (ref >60)
Glucose, Bld: 122 mg/dL — ABNORMAL HIGH (ref 70–99)
Potassium: 3.6 mmol/L (ref 3.5–5.1)
Sodium: 136 mmol/L (ref 135–145)

## 2022-08-30 LAB — HEMOGLOBIN A1C
Hgb A1c MFr Bld: 5.6 % (ref 4.8–5.6)
Mean Plasma Glucose: 114.02 mg/dL

## 2022-08-30 LAB — CBC
HCT: 38.3 % (ref 36.0–46.0)
Hemoglobin: 13.1 g/dL (ref 12.0–15.0)
MCH: 32.4 pg (ref 26.0–34.0)
MCHC: 34.2 g/dL (ref 30.0–36.0)
MCV: 94.8 fL (ref 80.0–100.0)
Platelets: 292 10*3/uL (ref 150–400)
RBC: 4.04 MIL/uL (ref 3.87–5.11)
RDW: 12.9 % (ref 11.5–15.5)
WBC: 8.3 10*3/uL (ref 4.0–10.5)
nRBC: 0 % (ref 0.0–0.2)

## 2022-08-30 LAB — POCT PREGNANCY, URINE: Preg Test, Ur: NEGATIVE

## 2022-08-30 NOTE — Progress Notes (Signed)
PCP - Jennifer Kane Cardiologist - denies Oncologist: Dorothy Puffer  PPM/ICD - denies   Chest x-ray - n/a EKG - 08/30/22 Stress Test - denies ECHO - 03/21/17 Cardiac Cath - denies  Sleep Study - has had one but was negative for sleep apnea  Fasting Blood Sugar - 110-115 a couple times a week   Last dose of GLP1 agonist-  OZEMPIC last dose 08/18/22 GLP1 instructions: do not take 7 days prior to surgery   ERAS Protcol -yes PRE-SURGERY Ensure or G2- g2 ordered and give  COVID TEST- not needed   Anesthesia review: yes, seed placement  Patient denies shortness of breath, fever, cough and chest pain at PAT appointment   All instructions explained to the patient, with a verbal understanding of the material. Patient agrees to go over the instructions while at home for a better understanding. Patient also instructed to self quarantine after being tested for COVID-19. The opportunity to ask questions was provided.

## 2022-08-31 ENCOUNTER — Other Ambulatory Visit: Payer: BC Managed Care – PPO

## 2022-09-04 ENCOUNTER — Ambulatory Visit
Admission: RE | Admit: 2022-09-04 | Discharge: 2022-09-04 | Disposition: A | Discharge status: Home / Self Care | Payer: BC Managed Care – PPO | Source: Ambulatory Visit | Attending: Surgery | Admitting: Surgery

## 2022-09-04 ENCOUNTER — Inpatient Hospital Stay: Payer: BC Managed Care – PPO | Attending: Hematology and Oncology | Admitting: Hematology and Oncology

## 2022-09-04 ENCOUNTER — Other Ambulatory Visit: Payer: Self-pay | Admitting: Surgery

## 2022-09-04 ENCOUNTER — Inpatient Hospital Stay: Payer: BC Managed Care – PPO

## 2022-09-04 ENCOUNTER — Other Ambulatory Visit: Payer: Self-pay

## 2022-09-04 VITALS — BP 113/73 | HR 74 | Temp 97.7°F | Resp 18 | Ht 63.0 in | Wt 229.4 lb

## 2022-09-04 DIAGNOSIS — C50211 Malignant neoplasm of upper-inner quadrant of right female breast: Secondary | ICD-10-CM | POA: Diagnosis present

## 2022-09-04 DIAGNOSIS — Z170 Estrogen receptor positive status [ER+]: Secondary | ICD-10-CM | POA: Diagnosis not present

## 2022-09-04 DIAGNOSIS — Z853 Personal history of malignant neoplasm of breast: Secondary | ICD-10-CM

## 2022-09-04 HISTORY — PX: BREAST BIOPSY: SHX20

## 2022-09-04 NOTE — Assessment & Plan Note (Signed)
08/07/2022:Mammogram detected suspicious mass in the right breast 12:30 position 1.1 cm (no evidence of malignancy in the area of concern in the LIQ) biopsy: Grade 2 IDC ER 95%, PR 100%, Ki67 10%, HER2 2+ by IHC negative by FISH ratio 1.09, copy #1.8  Pathology and radiology counseling:Discussed with the patient, the details of pathology including the type of breast cancer,the clinical staging, the significance of ER, PR and HER-2/neu receptors and the implications for treatment. After reviewing the pathology in detail, we proceeded to discuss the different treatment options between surgery, radiation, chemotherapy, antiestrogen therapies.  Recommendations: 1. Breast conserving surgery followed by 2. Oncotype DX testing to determine if chemotherapy would be of any benefit followed by 3. Adjuvant radiation therapy followed by 4. Adjuvant antiestrogen therapy  Oncotype counseling: I discussed Oncotype DX test. I explained to the patient that this is a 21 gene panel to evaluate patient tumors DNA to calculate recurrence score. This would help determine whether patient has high risk or low risk breast cancer. She understands that if her tumor was found to be high risk, she would benefit from systemic chemotherapy. If low risk, no need of chemotherapy.  Return to clinic after surgery to discuss final pathology report and then determine if Oncotype DX testing will need to be sent.

## 2022-09-04 NOTE — Progress Notes (Signed)
[facility] CONSULT NOTE  Patient Care Team: Sheela Stack as PCP - General (Physician Assistant) Selinda Flavin, MD (Family Medicine)  CHIEF COMPLAINTS/PURPOSE OF CONSULTATION:  Newly diagnosed breast cancer  HISTORY OF PRESENTING ILLNESS:  Jennifer Kane 47 y.o. female is here because of recent diagnosis of right breast cancer.  Patient initially felt something in the lower aspect of the right breast which led to mammogram and ultrasound.  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  She was seen by Dr. Magnus Ivan who is scheduling her for surgery on 09/06/2022.  She came in today accompanied by her husband to discuss entire treatment plan.  I reviewed her records extensively and collaborated the history with the patient.  SUMMARY OF ONCOLOGIC HISTORY: Oncology History  Malignant neoplasm of upper-inner quadrant of right breast in female, estrogen receptor positive  08/07/2022 Initial Diagnosis   Mammogram detected suspicious mass in the right breast 12:30 position 1.1 cm (no evidence of malignancy in the area of concern in the LIQ) biopsy: Grade 2 IDC ER 95%, PR 100%, Ki67 10%, HER2 2+ by IHC negative by FISH ratio 1.09, copy #1.8   09/04/2022 Cancer Staging   Staging form: Breast, AJCC 8th Edition - Clinical: Stage IA (cT1c, cN0, cM0, G2, ER+, PR+, HER2-) - Signed by Serena Croissant, MD on 09/04/2022 Stage prefix: Initial diagnosis Histologic grading system: 3 grade system      MEDICAL HISTORY:  Past Medical History:  Diagnosis Date   Anxiety    Asthma    Breast cancer 08/07/2022   Diabetes    Dysrhythmia    Hx of Irregular Heartbeat   GERD (gastroesophageal reflux disease)    History of panic attacks    History of tachycardia    Details not clear,  reported abnormal tilt table test at age 35, on chronic low-dose beta-blocker   PONV (postoperative nausea and vomiting)     SURGICAL HISTORY: Past Surgical History:  Procedure Laterality Date   BREAST BIOPSY Right 08/07/2022   [place] RT BREAST BX W LOC DEV 1ST LESION IMG BX SPEC US GUIDE 08/07/2022 [facility] MAMMOGRAP[facility]   CHOLECYSTECTOMY N/A 05/01/2018   Procedure: LAPAROSCOPIC CHOLECYSTECTOMY;  Surgeon: Rodman Pickle, MD;  Location: [facility];  Service: General;  Laterality: N/A;   KNEE ARTHROSCOPY  1989   No previous surgeries     WISDOM TOOTH EXTRACTION      SOCIAL HISTORY: Social History   Socioeconomic History   Marital status: Married    Spouse name: Not on file   Number of children: Not on file   Years of education: Not on file   Highest education level: Not on file  Occupational History   Not on file  Tobacco Use   Smoking status: Never   Smokeless tobacco: Never  Vaping Use   Vaping Use: Never used  Substance and Sexual Activity   Alcohol use: Yes    Comment: rarely   Drug use: No   Sexual activity: Not on file  Other Topics Concern   Not on file  Social History Narrative   Not on file   Social Determinants of Health   Financial Resource Strain: Not on file  Food Insecurity: Not on file  Transportation Needs: Not on file  Physical Activity: Not on file  Stress: Not on file  Social Connections: Not on file  Intimate Partner Violence: Not on file    FAMILY HISTORY: Family History  Problem Relation Age of Onset   Breast cancer Mother        48s   CAD Father    Breast cancer Paternal Aunt    Breast cancer Cousin        Maternal 1st Cousin   Breast cancer Maternal Aunt     ALLERGIES:  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  MEDICATIONS:  Current Outpatient Medications  Medication Sig Dispense Refill   EPINEPHrine (EPIPEN 2-PAK) 0.3 mg/0.3 mL IJ SOAJ injection Inject 0.3 mg into the muscle once.     escitalopram (LEXAPRO) 10 MG tablet Take 5 mg by  mouth at bedtime.     ibuprofen (ADVIL) 200 MG tablet Take 200 mg by mouth every 6 (six) hours as needed for moderate pain.     Semaglutide,0.25 or 0.5MG /DOS, (OZEMPIC, 0.25 OR 0.5 MG/DOSE,) 2 MG/1.5ML SOPN Inject 0.5 mg into the skin once a week.     No current facility-administered medications for this visit.    REVIEW OF SYSTEMS:   Constitutional: Denies fevers, chills or abnormal night sweats   All other systems were reviewed with the patient and are negative.  PHYSICAL EXAMINATION: ECOG PERFORMANCE STATUS: 0 - Asymptomatic  Vitals:   09/04/22 1538  BP: 113/73  Pulse: 74  Resp: 18  Temp: 97.7 F (36.5 C)  SpO2: 96%   Filed Weights   09/04/22 1538  Weight: 229 lb 6.4 oz (104.1 kg)    GENERAL:alert, no distress and comfortable    LABORATORY DATA:  I have reviewed the data as listed Lab Results  Component Value Date   WBC 8.3 08/30/2022   HGB 13.1 08/30/2022   HCT 38.3 08/30/2022   MCV 94.8 08/30/2022   PLT 292 08/30/2022   Lab Results  Component Value Date   NA 136 08/30/2022   K 3.6 08/30/2022   CL 103 08/30/2022   CO2 26 08/30/2022    RADIOGRAPHIC STUDIES: I have personally reviewed the radiological reports and agreed with the findings in the report.  ASSESSMENT AND PLAN:  Malignant neoplasm of upper-inner quadrant of right breast in female, estrogen receptor positive 08/07/2022:Mammogram detected suspicious mass in the right breast 12:30 position 1.1 cm (no evidence of malignancy in the area of concern in the LIQ) biopsy: Grade 2 IDC ER 95%, PR 100%, Ki67 10%, HER2 2+ by IHC negative by FISH ratio 1.09, copy #1.8  Pathology and radiology counseling:Discussed with the patient, the details of pathology including the type of breast cancer,the clinical staging, the significance of ER, PR and HER-2/neu receptors and the implications for treatment. After reviewing the pathology in detail, we proceeded to discuss the different treatment options between surgery,  radiation, chemotherapy, antiestrogen therapies.  Recommendations: 1. Breast conserving surgery followed by 2. Oncotype DX testing to determine if chemotherapy would be of any benefit followed by 3. Adjuvant radiation therapy followed by 4. Adjuvant antiestrogen therapy  Oncotype counseling: I discussed Oncotype DX test. I explained to the patient that this is a 21 gene panel to evaluate patient tumors DNA to calculate recurrence score. This would help determine whether patient has high risk or low risk breast cancer. She understands that if her tumor was found to be high risk, she would benefit from systemic chemotherapy. If low risk, no need of chemotherapy.  Return to clinic after surgery to  discuss final pathology report and then determine if Oncotype DX testing will need to be sent.   All questions were answered. The patient knows to call the clinic with any problems, questions or concerns.    Tamsen Meek, MD 09/04/22

## 2022-09-05 ENCOUNTER — Encounter: Payer: Self-pay | Admitting: *Deleted

## 2022-09-05 NOTE — H&P (Signed)
REFERRING PHYSICIAN: Dot Been* PROVIDER: Wayne Both, MD MRN: [id number] DOB: 11-12-1975 DATE OF ENCOUNTER: 08/20/2022 Subjective  Chief Complaint: New Consultation and Breast Cancer  History of Present Illness: Jennifer Kane is a 47 y.o. female who is seen today as an office consultation for evaluation of New Consultation and Breast Cancer  This is a 47 year old female was found on screening mammography to have a mass in her right breast. She was actually feeling an abnormality on the lower breast so her primary care provider went ahead and ordered her films which showed a mass at the 12:30 position of the right breast 7 cm from the nipple. It measured 1.1 cm in size. She underwent a biopsy of the mass showing an invasive ductal carcinoma which was grade 2. It was 95% ER positive, 100% PR positive, HER2 negative, and had a Ki-67 of 10%. She has had no problems with her breast previously. She denies nipple discharge. She has a family history of breast cancer in her mother in her 5s as well as a paternal aunt and paternal cousin. She has had negative genetic testing in the past. She is otherwise without complaints. She has had no previous problems with general anesthesia.  Review of Systems: A complete review of systems was obtained from the patient. I have reviewed this information and discussed as appropriate with the patient. See HPI as well for other ROS.  ROS  Medical History: Past Medical History: Diagnosis Date Anxiety Asthma, unspecified asthma severity, unspecified whether complicated, unspecified whether persistent (HHS-HCC) Diabetes mellitus without complication (CMS/HHS-HCC) History of cancer  There is no problem list on file for this patient.  Past Surgical History: Procedure Laterality Date CHOLECYSTECTOMY   Not on File  Current Outpatient Medications on File Prior to Visit Medication Sig Dispense Refill ALPRAZolam (XANAX) 0.25 MG tablet  Take 0.25 mg by mouth at bedtime as needed for Sleep cyanocobalamin (VITAMIN B12) 1,000 mcg/mL injection INJECT 1 ML INTRAMUSCULARLY monthly EPINEPHrine (EPIPEN) 0.3 mg/0.3 mL auto-injector Inject one dose intramuscularly for allergic reaction. May repeat one dose if needed after 5-15 minutes. Proceed to the ER escitalopram oxalate (LEXAPRO) 10 MG tablet Take 10 mg by mouth once daily OZEMPIC 1 mg/dose (4 mg/3 mL) pen injector Inject 1 mg subcutaneously every 7 (seven) days  No current facility-administered medications on file prior to visit.  Family History Problem Relation Age of Onset Diabetes Mother High blood pressure (Hypertension) Mother Hyperlipidemia (Elevated cholesterol) Mother Breast cancer Mother Skin cancer Mother Stroke Mother High blood pressure (Hypertension) Father Diabetes Father   Social History  Tobacco Use Smoking Status Never Smokeless Tobacco Never   Social History  Socioeconomic History Marital status: Married Tobacco Use Smoking status: Never Smokeless tobacco: Never Substance and Sexual Activity Alcohol use: Not Currently Drug use: Not Currently  Objective:  Vitals: 08/20/22 0936 BP: 122/80 Pulse: 78 Temp: 36.8 C (98.3 F) SpO2: 98% Weight: 100.7 kg (222 lb) Height: 160 cm ( )  Body mass index is 39.33 kg/m.  Physical Exam  She appears well on exam  On breast exam, I cannot palpate the mass now in the breast. There is some mild ecchymosis at the 12:30 position from the biopsy. Nipple areolar complexes are normal.  There is no axillary adenopathy  Labs, Imaging and Diagnostic Testing: I have reviewed her mammograms, ultrasound, and pathology results  Assessment and Plan:  Diagnoses and all orders for this visit:  Invasive ductal carcinoma of breast, female, right (CMS/HHS-HCC) - Ambulatory Referral to Radiation Oncology -  Ambulatory Referral to Oncology-Medical - Ambulatory Referral to Physical Therapy - Ambulatory  Referral to Cancer Genetics - [id number]   I gave the patient and her husband a copy the pathology results. She has an invasive ductal carcinoma of the right breast which is ER and PR positive. I explained breast cancer in detail. I explained surgical treatment of breast cancer as well as the multidisciplinary approach. From a surgical standpoint we discussed breast conservation versus mastectomy. She is interested in breast conservation. I next proceeded with discussing a radioactive seed guided right breast lumpectomy and sentinel lymph node biopsy. I explained the procedures in detail. We discussed the reasons to biopsy lymph nodes despite the normal ultrasound. We discussed the risk of surgery which includes but is not limited to bleeding, infection, injury to surrounding structures, seroma formation, the need for further surgery if margins or lymph nodes are positive, cardiopulmonary issues with general anesthesia, postoperative recovery, etc.

## 2022-09-05 NOTE — Anesthesia Preprocedure Evaluation (Addendum)
Anesthesia Evaluation  Patient identified by MRN, date of birth, ID band Patient awake    Reviewed: Allergy & Precautions, NPO status , Patient's Chart, lab work & pertinent test results  History of Anesthesia Complications (+) PONV and history of anesthetic complications  Airway Mallampati: III  TM Distance: >3 FB Neck ROM: Full   Comment: Previous grade I view with Miller 3, easy mask Dental  (+) Dental Advisory Given   Pulmonary neg shortness of breath, asthma , neg sleep apnea, neg COPD, neg recent URI   Pulmonary exam normal breath sounds clear to auscultation       Cardiovascular (-) hypertension(-) angina (-) Past MI, (-) Cardiac Stents and (-) CABG + dysrhythmias  Rhythm:Regular Rate:Normal  TTE 03/21/2017: Study Conclusions   - Left ventricle: The cavity size was normal. Wall thickness was    normal. Systolic function was normal. The estimated ejection    fraction was in the range of 60% to 65%. Wall motion was normal;    there were no regional wall motion abnormalities. Left    ventricular diastolic function parameters were normal.  - Aortic valve: Valve area (VTI): 2.2 cm^2. Valve area (Vmax): 1.82    cm^2. Valve area (Vmean): 1.96 cm^2.  - Systemic veins: IVC is small, suggesting low RA pressure and    hypovolemia.  - Technically adequate study.     Neuro/Psych  PSYCHIATRIC DISORDERS (panic attacks) Anxiety     negative neurological ROS     GI/Hepatic Neg liver ROS,GERD  ,,  Endo/Other  diabetes, Type 2  Morbid obesity  Renal/GU negative Renal ROS     Musculoskeletal   Abdominal  (+) + obese  Peds  Hematology negative hematology ROS (+)   Anesthesia Other Findings Breast cancer  Last Ozempic: 08/18/2022  Alpha-gal  Reproductive/Obstetrics                             Anesthesia Physical Anesthesia Plan  ASA: 3  Anesthesia Plan: General   Post-op Pain Management:  Regional block*   Induction: Intravenous  PONV Risk Score and Plan: 4 or greater and Ondansetron, Dexamethasone, Propofol infusion, TIVA, Treatment may vary due to age or medical condition and Scopolamine patch - Pre-op  Airway Management Planned: LMA  Additional Equipment:   Intra-op Plan:   Post-operative Plan: Extubation in OR  Informed Consent: I have reviewed the patients History and Physical, chart, labs and discussed the procedure including the risks, benefits and alternatives for the proposed anesthesia with the patient or authorized representative who has indicated his/her understanding and acceptance.     Dental advisory given  Plan Discussed with: Anesthesiologist and CRNA  Anesthesia Plan Comments: (Discussed potential risks of nerve blocks including, but not limited to, infection, bleeding, nerve damage, seizures, pneumothorax, respiratory depression, and potential failure of the block. Alternatives to nerve blocks discussed. All questions answered.  Risks of general anesthesia discussed including, but not limited to, sore throat, hoarse voice, chipped/damaged teeth, injury to vocal cords, nausea and vomiting, allergic reactions, lung infection, heart attack, stroke, and death. All questions answered. )       Anesthesia Quick Evaluation

## 2022-09-06 ENCOUNTER — Other Ambulatory Visit: Payer: Self-pay

## 2022-09-06 ENCOUNTER — Ambulatory Visit
Admission: RE | Admit: 2022-09-06 | Discharge: 2022-09-06 | Disposition: A | Discharge status: Home / Self Care | Payer: BC Managed Care – PPO | Source: Ambulatory Visit | Attending: Surgery | Admitting: Surgery

## 2022-09-06 ENCOUNTER — Ambulatory Visit (HOSPITAL_COMMUNITY)
Admission: RE | Admit: 2022-09-06 | Discharge: 2022-09-06 | Disposition: A | Discharge status: Home / Self Care | Payer: BC Managed Care – PPO | Attending: Surgery | Admitting: Surgery

## 2022-09-06 ENCOUNTER — Ambulatory Visit (HOSPITAL_COMMUNITY): Payer: BC Managed Care – PPO | Admitting: Physician Assistant

## 2022-09-06 ENCOUNTER — Ambulatory Visit (HOSPITAL_COMMUNITY): Payer: BC Managed Care – PPO | Admitting: Anesthesiology

## 2022-09-06 ENCOUNTER — Encounter (HOSPITAL_COMMUNITY)
Admission: RE | Disposition: A | Discharge status: Home / Self Care | Payer: Self-pay | Source: Home / Self Care | Attending: Surgery

## 2022-09-06 ENCOUNTER — Telehealth: Payer: Self-pay | Admitting: *Deleted

## 2022-09-06 DIAGNOSIS — E119 Type 2 diabetes mellitus without complications: Secondary | ICD-10-CM | POA: Diagnosis not present

## 2022-09-06 DIAGNOSIS — Z803 Family history of malignant neoplasm of breast: Secondary | ICD-10-CM | POA: Diagnosis not present

## 2022-09-06 DIAGNOSIS — Z170 Estrogen receptor positive status [ER+]: Secondary | ICD-10-CM | POA: Insufficient documentation

## 2022-09-06 DIAGNOSIS — Z6838 Body mass index (BMI) 38.0-38.9, adult: Secondary | ICD-10-CM | POA: Insufficient documentation

## 2022-09-06 DIAGNOSIS — C50211 Malignant neoplasm of upper-inner quadrant of right female breast: Secondary | ICD-10-CM | POA: Diagnosis not present

## 2022-09-06 DIAGNOSIS — Z853 Personal history of malignant neoplasm of breast: Secondary | ICD-10-CM

## 2022-09-06 DIAGNOSIS — F410 Panic disorder [episodic paroxysmal anxiety] without agoraphobia: Secondary | ICD-10-CM | POA: Diagnosis not present

## 2022-09-06 HISTORY — PX: BREAST LUMPECTOMY WITH RADIOACTIVE SEED AND SENTINEL LYMPH NODE BIOPSY: SHX6550

## 2022-09-06 LAB — GLUCOSE, CAPILLARY: Glucose-Capillary: 125 mg/dL — ABNORMAL HIGH (ref 70–99)

## 2022-09-06 SURGERY — BREAST LUMPECTOMY WITH RADIOACTIVE SEED AND SENTINEL LYMPH NODE BIOPSY
Anesthesia: General | Site: Breast | Laterality: Right

## 2022-09-06 MED ORDER — INSULIN ASPART 100 UNIT/ML IJ SOLN: 0.0000 [IU] | INTRAMUSCULAR | Status: DC | PRN

## 2022-09-06 MED ORDER — ORAL CARE MOUTH RINSE: 15.0000 mL | Freq: Once | OROMUCOSAL | Status: AC

## 2022-09-06 MED ORDER — FENTANYL CITRATE (PF) 100 MCG/2ML IJ SOLN: 25.0000 ug | INTRAMUSCULAR | Status: DC | PRN

## 2022-09-06 MED ORDER — METHYLENE BLUE 1 % INJ SOLN
INTRAVENOUS | Status: AC
  Filled 2022-09-06: qty 10

## 2022-09-06 MED ORDER — OXYCODONE HCL 5 MG PO TABS: 5.0000 mg | ORAL_TABLET | Freq: Once | ORAL | Status: DC | PRN

## 2022-09-06 MED ORDER — SCOPOLAMINE 1 MG/3DAYS TD PT72
MEDICATED_PATCH | TRANSDERMAL | Status: AC
  Administered 2022-09-06: 1.5 mg via TRANSDERMAL
  Filled 2022-09-06: qty 1

## 2022-09-06 MED ORDER — SCOPOLAMINE 1 MG/3DAYS TD PT72: 1.0000 | MEDICATED_PATCH | TRANSDERMAL | Status: DC

## 2022-09-06 MED ORDER — CHLORHEXIDINE GLUCONATE 0.12 % MT SOLN: 15.0000 mL | Freq: Once | OROMUCOSAL | Status: AC

## 2022-09-06 MED ORDER — PHENYLEPHRINE 80 MCG/ML (10ML) SYRINGE FOR IV PUSH (FOR BLOOD PRESSURE SUPPORT)
PREFILLED_SYRINGE | INTRAVENOUS | Status: AC
  Filled 2022-09-06: qty 10

## 2022-09-06 MED ORDER — ONDANSETRON HCL 4 MG/2ML IJ SOLN
INTRAMUSCULAR | Status: DC | PRN
  Administered 2022-09-06: 4 mg via INTRAVENOUS

## 2022-09-06 MED ORDER — OXYCODONE HCL 5 MG/5ML PO SOLN: 5.0000 mg | Freq: Once | ORAL | Status: DC | PRN

## 2022-09-06 MED ORDER — LIDOCAINE 2% (20 MG/ML) 5 ML SYRINGE
INTRAMUSCULAR | Status: AC
  Filled 2022-09-06: qty 5

## 2022-09-06 MED ORDER — ONDANSETRON HCL 4 MG/2ML IJ SOLN
INTRAMUSCULAR | Status: AC
  Filled 2022-09-06: qty 2

## 2022-09-06 MED ORDER — ACETAMINOPHEN 500 MG PO TABS
ORAL_TABLET | ORAL | Status: AC
  Administered 2022-09-06: 1000 mg via ORAL
  Filled 2022-09-06: qty 2

## 2022-09-06 MED ORDER — ACETAMINOPHEN 500 MG PO TABS: 1000.0000 mg | ORAL_TABLET | ORAL | Status: AC

## 2022-09-06 MED ORDER — ENSURE PRE-SURGERY PO LIQD: 296.0000 mL | Freq: Once | ORAL | Status: DC

## 2022-09-06 MED ORDER — EPHEDRINE SULFATE-NACL 50-0.9 MG/10ML-% IV SOSY
PREFILLED_SYRINGE | INTRAVENOUS | Status: DC | PRN
  Administered 2022-09-06 (×3): 10 mg via INTRAVENOUS
  Administered 2022-09-06: 5 mg via INTRAVENOUS

## 2022-09-06 MED ORDER — CHLORHEXIDINE GLUCONATE 0.12 % MT SOLN
OROMUCOSAL | Status: AC
  Administered 2022-09-06: 15 mL via OROMUCOSAL
  Filled 2022-09-06: qty 15

## 2022-09-06 MED ORDER — CEFAZOLIN SODIUM-DEXTROSE 2-4 GM/100ML-% IV SOLN
2.0000 g | INTRAVENOUS | Status: AC
  Administered 2022-09-06: 2 g via INTRAVENOUS

## 2022-09-06 MED ORDER — MIDAZOLAM HCL 2 MG/2ML IJ SOLN
INTRAMUSCULAR | Status: AC
  Filled 2022-09-06: qty 2

## 2022-09-06 MED ORDER — BUPIVACAINE-EPINEPHRINE (PF) 0.25% -1:200000 IJ SOLN
INTRAMUSCULAR | Status: AC
  Filled 2022-09-06: qty 30

## 2022-09-06 MED ORDER — PROPOFOL 10 MG/ML IV BOLUS
INTRAVENOUS | Status: AC
  Filled 2022-09-06: qty 20

## 2022-09-06 MED ORDER — PROPOFOL 10 MG/ML IV BOLUS
INTRAVENOUS | Status: DC | PRN
  Administered 2022-09-06: 150 mg via INTRAVENOUS
  Administered 2022-09-06 (×2): 50 mg via INTRAVENOUS

## 2022-09-06 MED ORDER — CHLORHEXIDINE GLUCONATE CLOTH 2 % EX PADS: 6.0000 | MEDICATED_PAD | Freq: Once | CUTANEOUS | Status: DC

## 2022-09-06 MED ORDER — AMISULPRIDE (ANTIEMETIC) 5 MG/2ML IV SOLN: 10.0000 mg | Freq: Once | INTRAVENOUS | Status: DC | PRN

## 2022-09-06 MED ORDER — 0.9 % SODIUM CHLORIDE (POUR BTL) OPTIME
TOPICAL | Status: DC | PRN
  Administered 2022-09-06: 1000 mL

## 2022-09-06 MED ORDER — PROPOFOL 500 MG/50ML IV EMUL
INTRAVENOUS | Status: DC | PRN
  Administered 2022-09-06: 50 ug/kg/min via INTRAVENOUS

## 2022-09-06 MED ORDER — FENTANYL CITRATE (PF) 250 MCG/5ML IJ SOLN
INTRAMUSCULAR | Status: DC | PRN
  Administered 2022-09-06 (×2): 100 ug via INTRAVENOUS
  Administered 2022-09-06: 50 ug via INTRAVENOUS

## 2022-09-06 MED ORDER — CEFAZOLIN SODIUM-DEXTROSE 2-4 GM/100ML-% IV SOLN
INTRAVENOUS | Status: AC
  Filled 2022-09-06: qty 100

## 2022-09-06 MED ORDER — DEXAMETHASONE SODIUM PHOSPHATE 10 MG/ML IJ SOLN
INTRAMUSCULAR | Status: DC | PRN
  Administered 2022-09-06: 10 mg via INTRAVENOUS

## 2022-09-06 MED ORDER — LIDOCAINE 2% (20 MG/ML) 5 ML SYRINGE
INTRAMUSCULAR | Status: DC | PRN
  Administered 2022-09-06: 100 mg via INTRAVENOUS

## 2022-09-06 MED ORDER — TRAMADOL HCL 50 MG PO TABS: 50.0000 mg | ORAL_TABLET | Freq: Four times a day (QID) | ORAL | 0 refills | Status: DC | PRN

## 2022-09-06 MED ORDER — BUPIVACAINE-EPINEPHRINE 0.25% -1:200000 IJ SOLN
INTRAMUSCULAR | Status: DC | PRN
  Administered 2022-09-06: 20 mL

## 2022-09-06 MED ORDER — SUCCINYLCHOLINE CHLORIDE 200 MG/10ML IV SOSY
PREFILLED_SYRINGE | INTRAVENOUS | Status: DC | PRN
  Administered 2022-09-06: 120 mg via INTRAVENOUS

## 2022-09-06 MED ORDER — LACTATED RINGERS IV SOLN: INTRAVENOUS | Status: DC

## 2022-09-06 MED ORDER — BUPIVACAINE HCL (PF) 0.25 % IJ SOLN
INTRAMUSCULAR | Status: DC | PRN
  Administered 2022-09-06: 30 mL via PERINEURAL

## 2022-09-06 MED ORDER — EPHEDRINE 5 MG/ML INJ
INTRAVENOUS | Status: AC
  Filled 2022-09-06: qty 5

## 2022-09-06 MED ORDER — FENTANYL CITRATE (PF) 250 MCG/5ML IJ SOLN
INTRAMUSCULAR | Status: AC
  Filled 2022-09-06: qty 5

## 2022-09-06 MED ORDER — MIDAZOLAM HCL 2 MG/2ML IJ SOLN
INTRAMUSCULAR | Status: DC | PRN
  Administered 2022-09-06: 2 mg via INTRAVENOUS

## 2022-09-06 MED ORDER — DEXAMETHASONE SODIUM PHOSPHATE 10 MG/ML IJ SOLN
INTRAMUSCULAR | Status: AC
  Filled 2022-09-06: qty 1

## 2022-09-06 MED ORDER — MAGTRACE LYMPHATIC TRACER
INTRAMUSCULAR | Status: DC | PRN
  Administered 2022-09-06: 2 mL via INTRAMUSCULAR

## 2022-09-06 SURGICAL SUPPLY — 44 items
ADH SKN CLS APL DERMABOND .7 (GAUZE/BANDAGES/DRESSINGS) ×1
APL PRP STRL LF DISP 70% ISPRP (MISCELLANEOUS) ×1
APPLIER CLIP 9.375 MED OPEN (MISCELLANEOUS) ×1
APR CLP MED 9.3 20 MLT OPN (MISCELLANEOUS) ×1
BAG COUNTER SPONGE SURGICOUNT (BAG) ×1 IMPLANT
BAG SPNG CNTER NS LX DISP (BAG) ×1
BINDER BREAST LRG (GAUZE/BANDAGES/DRESSINGS) IMPLANT
BINDER BREAST XLRG (GAUZE/BANDAGES/DRESSINGS) IMPLANT
BINDER BREAST XXLRG (GAUZE/BANDAGES/DRESSINGS) IMPLANT
CANISTER SUCT 3000ML PPV (MISCELLANEOUS) ×1 IMPLANT
CHLORAPREP W/TINT 26 (MISCELLANEOUS) ×1 IMPLANT
CLIP APPLIE 9.375 MED OPEN (MISCELLANEOUS) ×1 IMPLANT
CNTNR URN SCR LID CUP LEK RST (MISCELLANEOUS) IMPLANT
CONT SPEC 4OZ STRL OR WHT (MISCELLANEOUS)
COVER PROBE W GEL 5X96 (DRAPES) ×2 IMPLANT
COVER SURGICAL LIGHT HANDLE (MISCELLANEOUS) ×1 IMPLANT
DERMABOND ADVANCED .7 DNX12 (GAUZE/BANDAGES/DRESSINGS) ×1 IMPLANT
DEVICE DUBIN SPECIMEN MAMMOGRA (MISCELLANEOUS) ×1 IMPLANT
DRAPE CHEST BREAST 15X10 FENES (DRAPES) ×1 IMPLANT
ELECT CAUTERY BLADE 6.4 (BLADE) ×1 IMPLANT
ELECT REM PT RETURN 9FT ADLT (ELECTROSURGICAL) ×1
ELECTRODE REM PT RTRN 9FT ADLT (ELECTROSURGICAL) ×1 IMPLANT
GAUZE PAD ABD 8X10 STRL (GAUZE/BANDAGES/DRESSINGS) IMPLANT
GLOVE SURG SIGNA 7.5 PF LTX (GLOVE) ×1 IMPLANT
GOWN STRL REUS W/ TWL LRG LVL3 (GOWN DISPOSABLE) ×1 IMPLANT
GOWN STRL REUS W/ TWL XL LVL3 (GOWN DISPOSABLE) ×1 IMPLANT
GOWN STRL REUS W/TWL LRG LVL3 (GOWN DISPOSABLE) ×1
GOWN STRL REUS W/TWL XL LVL3 (GOWN DISPOSABLE) ×1
KIT BASIN OR (CUSTOM PROCEDURE TRAY) ×1 IMPLANT
KIT MARKER MARGIN INK (KITS) ×1 IMPLANT
NDL 18GX1X1/2 (RX/OR ONLY) (NEEDLE) IMPLANT
NDL FILTER BLUNT 18X1 1/2 (NEEDLE) IMPLANT
NDL HYPO 25GX1X1/2 BEV (NEEDLE) ×1 IMPLANT
NEEDLE 18GX1X1/2 (RX/OR ONLY) (NEEDLE) IMPLANT
NEEDLE FILTER BLUNT 18X1 1/2 (NEEDLE) IMPLANT
NEEDLE HYPO 25GX1X1/2 BEV (NEEDLE) ×1 IMPLANT
NS IRRIG 1000ML POUR BTL (IV SOLUTION) ×1 IMPLANT
PACK GENERAL/GYN (CUSTOM PROCEDURE TRAY) ×1 IMPLANT
SUT MNCRL AB 4-0 PS2 18 (SUTURE) ×1 IMPLANT
SUT VIC AB 3-0 SH 18 (SUTURE) ×1 IMPLANT
SYR 3ML LL SCALE MARK (SYRINGE) IMPLANT
SYR CONTROL 10ML LL (SYRINGE) ×1 IMPLANT
TOWEL GREEN STERILE (TOWEL DISPOSABLE) ×1 IMPLANT
TOWEL GREEN STERILE FF (TOWEL DISPOSABLE) ×1 IMPLANT

## 2022-09-06 NOTE — Transfer of Care (Signed)
  Immediate Anesthesia Transfer of Care Note  Patient: Jennifer Kane  Procedure(s) Performed: RIGHT BREAST LUMPECTOMY WITH RADIOACTIVE SEED AND SENTINEL LYMPH NODE BIOPSY (Right: Breast)  Patient Location: PACU  Anesthesia Type:General and GA combined with regional for post-op pain  Level of Consciousness: awake, oriented, and patient cooperative  Airway & Oxygen Therapy: Patient Spontanous Breathing and Patient connected to face mask oxygen  Post-op Assessment: Report given to RN and Post -op Vital signs reviewed and stable  Post vital signs: Reviewed and stable  Last Vitals:  Vitals Value Taken Time  BP 131/76 09/06/22 0900  Temp 36.2 C 09/06/22 0855  Pulse 98 09/06/22 0901  Resp 18 09/06/22 0901  SpO2 98 % 09/06/22 0901  Vitals shown include unvalidated device data.  Last Pain:  Vitals:   09/06/22 0855  TempSrc:   PainSc: 0-No pain         Complications: No notable events documented.

## 2022-09-06 NOTE — Discharge Instructions (Signed)
[facility] Office Phone Number [phone]  BREAST BIOPSY/ PARTIAL MASTECTOMY: POST OP INSTRUCTIONS  Always review your discharge instruction sheet given to you by the facility where your surgery was performed.  IF YOU HAVE DISABILITY OR FAMILY LEAVE FORMS, YOU MUST BRING THEM TO THE OFFICE FOR PROCESSING.  DO NOT GIVE THEM TO YOUR DOCTOR.  A prescription for pain medication may be given to you upon discharge.  Take your pain medication as prescribed, if needed.  If narcotic pain medicine is not needed, then you may take acetaminophen (Tylenol) or ibuprofen (Advil) as needed. Take your usually prescribed medications unless otherwise directed If you need a refill on your pain medication, please contact your pharmacy.  They will contact our office to request authorization.  Prescriptions will not be filled after 5pm or on week-ends. You should eat very light the first 24 hours after surgery, such as soup, crackers, pudding, etc.  Resume your normal diet the day after surgery. Most patients will experience some swelling and bruising in the breast.  Ice packs and a good support bra will help.  Swelling and bruising can take several days to resolve.  It is common to experience some constipation if taking pain medication after surgery.  Increasing fluid intake and taking a stool softener will usually help or prevent this problem from occurring.  A mild laxative (Milk of Magnesia or Miralax) should be taken according to package directions if there are no bowel movements after 48 hours. Unless discharge instructions indicate otherwise, you may remove your bandages 24-48 hours after surgery, and you may shower at that time.  You may have steri-strips (small skin tapes) in place directly over the incision.  These strips should be left on the skin for 7-10 days.  If your surgeon used skin glue on the incision, you may shower in 24 hours.  The glue will flake off over the next 2-3 weeks.  Any  sutures or staples will be removed at the office during your follow-up visit. ACTIVITIES:  You may resume regular daily activities (gradually increasing) beginning the next day.  Wearing a good support bra or sports bra minimizes pain and swelling.  You may have sexual intercourse when it is comfortable. You may drive when you no longer are taking prescription pain medication, you can comfortably wear a seatbelt, and you can safely maneuver your car and apply brakes. RETURN TO WORK:  ______________________________________________________________________________________ You should see your doctor in the office for a follow-up appointment approximately two weeks after your surgery.  Your doctor's nurse will typically make your follow-up appointment when she calls you with your pathology report.  Expect your pathology report 2-3 business days after your surgery.  You may call to check if you do not hear from us after three days. OTHER INSTRUCTIONS: YOU MAY REMOVE THE BINDER AND SHOWER STARTING TOMORROW ICE PACK, TYLENOL, AND IBUPROFEN ALSO FOR PAIN NO VIGOROUS ACTIVITY FOR ONE WEEK _______________________________________________________________________________________________ _____________________________________________________________________________________________________________________________________ _____________________________________________________________________________________________________________________________________ _____________________________________________________________________________________________________________________________________  WHEN TO CALL YOUR DOCTOR: Fever over 101.0 Nausea and/or vomiting. Extreme swelling or bruising. Continued bleeding from incision. Increased pain, redness, or drainage from the incision.  The clinic staff is available to answer your questions during regular business hours.  Please don't hesitate to call and ask to speak to one of the  nurses for clinical concerns.  If you have a medical emergency, go to the nearest emergency room or call 911.  A surgeon from [facility] is always on call at the hospital.  For   further questions, please visit [email or web address]   

## 2022-09-06 NOTE — Interval H&P Note (Signed)
History and Physical Interval Note:no change in H and P  09/06/2022 6:54 AM  Jennifer Kane  has presented today for surgery, with the diagnosis of RIGHT BREAST CANCER.  The various methods of treatment have been discussed with the patient and family. After consideration of risks, benefits and other options for treatment, the patient has consented to  Procedure(s): RIGHT BREAST LUMPECTOMY WITH RADIOACTIVE SEED AND SENTINEL LYMPH NODE BIOPSY (Right) as a surgical intervention.  The patient's history has been reviewed, patient examined, no change in status, stable for surgery.  I have reviewed the patient's chart and labs.  Questions were answered to the patient's satisfaction.     Abigail Miyamoto

## 2022-09-06 NOTE — Anesthesia Procedure Notes (Signed)
Anesthesia Regional Block: Pectoralis block   Pre-Anesthetic Checklist: , timeout performed,  Correct Patient, Correct Site, Correct Laterality,  Correct Procedure, Correct Position, site marked,  Risks and benefits discussed,  Surgical consent,  Pre-op evaluation,  At surgeon's request and post-op pain management  Laterality: Right  Prep: chloraprep       Needles:  Injection technique: Single-shot  Needle Type: Echogenic Stimulator Needle     Needle Length: 9cm  Needle Gauge: 21     Additional Needles:   Procedures:,,,, ultrasound used (permanent image in chart),,    Narrative:  Start time: 09/06/2022 7:18 AM End time: 09/06/2022 7:20 AM Injection made incrementally with aspirations every 5 mL.  Performed by: Personally  Anesthesiologist: Linton Rump, MD  Additional Notes: Discussed risks and benefits of nerve block including, but not limited to, prolonged and/or permanent nerve injury involving sensory and/or motor function. Monitors were applied and a time-out was performed. The nerve and associated structures were visualized under ultrasound guidance. After negative aspiration, local anesthetic was slowly injected around the nerve. There was no evidence of high pressure during the procedure. There were no paresthesias. VSS remained stable and the patient tolerated the procedure well.

## 2022-09-06 NOTE — Telephone Encounter (Signed)
Spoke with patient to follow up from new patient appt and assess navigation needs. Patient denies any questions or concerns at this time.  Encouraged her to call should anything arise. Contact information given. Patient verbalized understanding. ?

## 2022-09-06 NOTE — Anesthesia Procedure Notes (Signed)
Procedure Name: Intubation Date/Time: 09/06/2022 7:45 AM  Performed by: Orlin Hilding, CRNAPre-anesthesia Checklist: Patient identified, Emergency Drugs available, Suction available, Patient being monitored and Timeout performed Patient Re-evaluated:Patient Re-evaluated prior to induction Oxygen Delivery Method: Circle system utilized Preoxygenation: Pre-oxygenation with 100% oxygen Induction Type: IV induction Ventilation: Mask ventilation without difficulty and Oral airway inserted - appropriate to patient size Laryngoscope Size: Mac and 3 Grade View: Grade III Tube type: Oral Tube size: 7.5 mm Number of attempts: 1 Placement Confirmation: ETT inserted through vocal cords under direct vision, positive ETCO2 and breath sounds checked- equal and bilateral Secured at: 24 cm Tube secured with: Tape Dental Injury: Teeth and Oropharynx as per pre-operative assessment

## 2022-09-06 NOTE — Op Note (Signed)
   Jennifer Kane 09/06/2022   Pre-op Diagnosis: RIGHT BREAST CANCER     Post-op Diagnosis: same  Procedure(s): RADIOACTIVE SEED GUIDED RIGHT BREAST LUMPECTOMY DEEP RIGHT AXILLARY SENTINEL LYMPH NODE BIOPSY INJECTION OF MAG TRACE FOR LYMPH NODE MAPPING   Surgeon(s): Abigail Miyamoto, MD  Anesthesia: General  Staff:  Circulator: Ivin Booty, RN Scrub Person: Mikey Bussing  Estimated Blood Loss: Minimal               Specimens: sent to path  Indications: This is a 47 year old female was found on recent mammogram to have a mass in the right breast at the 12:30 position.  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  The decision was made to proceed with a radioactive seed guided right breast lumpectomy and sentinel lymph node biopsy  Procedure: The patient was brought to the operating room and identified the correct patient.  She was placed upon the operating table and general anesthesia was induced.  I injected mag trace underneath the right nipple areolar complex and massaged the breast.  Her right breast and axilla were then prepped and draped in usual sterile fashion.  Using the neoprobe I located the radioactive seed at least 8 cm from the nipple areolar complex at the 12:30 position of the right breast.  I anesthetized skin over the area with Marcaine and then made a longitudinal incision with a scalpel.  I then dissected down to the breast tissue with the cautery.  With the aid of the neoprobe I then performed a wide lumpectomy with the cautery staying widely around the signal from the radioactive seed.  I took this all the way down to near the chest wall and then completed the lumpectomy staying underneath the radioactive seed.  Once the specimen was removed I marked all margins with paint.  An x-ray on the specimen confirmed that the radioactive seed and previous biopsy clip were in the specimen.  The anterior margin is skin.  The specimen was sent to  pathology for evaluation. I next identified an area of increased uptake in the right axilla with MAC trace probe.  I anesthetized the skin with Marcaine and then made incision with a scalpel.  I then dissected down into the deep right axillary space with the cautery.  With the aid of the probe I then identified several sentinel lymph nodes which were excised and sent to pathology for evaluation.  Hemostasis was achieved with the cautery and surgical clips.  I found no other increased uptake of the MAC trace with the probe in the axilla and found no palpable enlarged lymph nodes remaining.  At this point I anesthetized the lumpectomy site further with Marcaine.  I placed surgical clips around the periphery of the lumpectomy cavity.  I then closed the subcutaneous tissue with interrupted 3-0 Vicryl sutures and closed the skin with a running 4-0 Monocryl.  I then closed the axillary incision with 3-0 Vicryl and 4-0 Monocryl sutures as well.  Dermabond was placed to both incisions.  The patient tolerated the procedure well.  All the counts were correct at the end of the procedure.  She was placed in a breast binder and then extubated in the operating room and taken in a stable condition to the recovery room.          Abigail Miyamoto   Date: 09/06/2022  Time: 8:37 AM

## 2022-09-06 NOTE — Anesthesia Postprocedure Evaluation (Signed)
Anesthesia Post Note  Patient: Jennifer Kane  Procedure(s) Performed: RIGHT BREAST LUMPECTOMY WITH RADIOACTIVE SEED AND SENTINEL LYMPH NODE BIOPSY (Right: Breast)     Patient location during evaluation: PACU Anesthesia Type: General Level of consciousness: awake Pain management: pain level controlled Vital Signs Assessment: post-procedure vital signs reviewed and stable Respiratory status: spontaneous breathing, nonlabored ventilation and respiratory function stable Cardiovascular status: blood pressure returned to baseline and stable Postop Assessment: no apparent nausea or vomiting Anesthetic complications: no   No notable events documented.  Last Vitals:  Vitals:   09/06/22 0900 09/06/22 0915  BP: 131/76 117/75  Pulse: 100 91  Resp: 14 20  Temp:  (!) 36.2 C  SpO2: 95% 95%    Last Pain:  Vitals:   09/06/22 0915  TempSrc:   PainSc: 0-No pain                 Linton Rump

## 2022-09-07 ENCOUNTER — Encounter (HOSPITAL_COMMUNITY): Payer: Self-pay | Admitting: Surgery

## 2022-09-07 LAB — SURGICAL PATHOLOGY

## 2022-09-11 ENCOUNTER — Telehealth: Payer: Self-pay | Admitting: Licensed Clinical Social Worker

## 2022-09-11 ENCOUNTER — Other Ambulatory Visit: Payer: Self-pay | Admitting: *Deleted

## 2022-09-11 DIAGNOSIS — C50211 Malignant neoplasm of upper-inner quadrant of right female breast: Secondary | ICD-10-CM

## 2022-09-11 NOTE — Telephone Encounter (Signed)
[facility] Clinical Social Work  Clinical Social Work was referred by new patient protocol for assessment of psychosocial needs.  Clinical Social Worker  attempted to contact patient by phone   to offer support and assess for needs.   No answer. Left VM with direct contact information.      Hassen Bruun E Arlene Genova, LCSW  Clinical Social Worker [facility]        

## 2022-09-12 ENCOUNTER — Encounter: Payer: Self-pay | Admitting: Genetic Counselor

## 2022-09-12 ENCOUNTER — Inpatient Hospital Stay: Payer: BC Managed Care – PPO | Attending: Genetic Counselor | Admitting: Genetic Counselor

## 2022-09-12 ENCOUNTER — Other Ambulatory Visit: Payer: Self-pay | Admitting: Genetic Counselor

## 2022-09-12 ENCOUNTER — Other Ambulatory Visit: Payer: Self-pay

## 2022-09-12 ENCOUNTER — Inpatient Hospital Stay: Payer: BC Managed Care – PPO

## 2022-09-12 DIAGNOSIS — C50211 Malignant neoplasm of upper-inner quadrant of right female breast: Secondary | ICD-10-CM | POA: Diagnosis present

## 2022-09-12 DIAGNOSIS — Z170 Estrogen receptor positive status [ER+]: Secondary | ICD-10-CM

## 2022-09-12 DIAGNOSIS — Z808 Family history of malignant neoplasm of other organs or systems: Secondary | ICD-10-CM | POA: Insufficient documentation

## 2022-09-12 DIAGNOSIS — Z8041 Family history of malignant neoplasm of ovary: Secondary | ICD-10-CM | POA: Diagnosis not present

## 2022-09-12 DIAGNOSIS — Z803 Family history of malignant neoplasm of breast: Secondary | ICD-10-CM | POA: Insufficient documentation

## 2022-09-12 LAB — CMP (CANCER CENTER ONLY)
ALT: 39 U/L (ref 0–44)
AST: 25 U/L (ref 15–41)
Albumin: 4 g/dL (ref 3.5–5.0)
Alkaline Phosphatase: 69 U/L (ref 38–126)
Anion gap: 3 — ABNORMAL LOW (ref 5–15)
BUN: 11 mg/dL (ref 6–20)
CO2: 29 mmol/L (ref 22–32)
Calcium: 8.8 mg/dL — ABNORMAL LOW (ref 8.9–10.3)
Chloride: 105 mmol/L (ref 98–111)
Creatinine: 0.68 mg/dL (ref 0.44–1.00)
GFR, Estimated: >60 mL/min (ref >60)
Glucose, Bld: 140 mg/dL — ABNORMAL HIGH (ref 70–99)
Potassium: 4.1 mmol/L (ref 3.5–5.1)
Sodium: 137 mmol/L (ref 135–145)
Total Bilirubin: 0.4 mg/dL (ref 0.3–1.2)
Total Protein: 7.1 g/dL (ref 6.5–8.1)

## 2022-09-12 LAB — CBC WITH DIFFERENTIAL (CANCER CENTER ONLY)
Abs Immature Granulocytes: 0.04 10*3/uL (ref 0.00–0.07)
Basophils Absolute: 0.1 10*3/uL (ref 0.0–0.1)
Basophils Relative: 1 %
Eosinophils Absolute: 0.1 10*3/uL (ref 0.0–0.5)
Eosinophils Relative: 1 %
HCT: 40.6 % (ref 36.0–46.0)
Hemoglobin: 13.8 g/dL (ref 12.0–15.0)
Immature Granulocytes: 1 %
Lymphocytes Relative: 28 %
Lymphs Abs: 2.2 10*3/uL (ref 0.7–4.0)
MCH: 32.2 pg (ref 26.0–34.0)
MCHC: 34 g/dL (ref 30.0–36.0)
MCV: 94.6 fL (ref 80.0–100.0)
Monocytes Absolute: 0.7 10*3/uL (ref 0.1–1.0)
Monocytes Relative: 8 %
Neutro Abs: 5 10*3/uL (ref 1.7–7.7)
Neutrophils Relative %: 61 %
Platelet Count: 298 10*3/uL (ref 150–400)
RBC: 4.29 MIL/uL (ref 3.87–5.11)
RDW: 13 % (ref 11.5–15.5)
WBC Count: 8.1 10*3/uL (ref 4.0–10.5)
nRBC: 0 % (ref 0.0–0.2)

## 2022-09-12 LAB — GENETIC SCREENING ORDER

## 2022-09-12 NOTE — Progress Notes (Signed)
REFERRING PROVIDER: Abigail Miyamoto, MD [address] [place],  [place] [address]  PRIMARY PROVIDER:  [name], PA-C  PRIMARY REASON FOR VISIT:  1. Family history of breast cancer   2. Family history of ovarian cancer   3. Family history of brain cancer   4. Malignant neoplasm of upper-inner quadrant of right breast in female, estrogen receptor positive (HCC)      HISTORY OF PRESENT ILLNESS:   Jennifer Kane, a 47 y.o. female, was seen for a [place] cancer genetics consultation at the request of Dr. Magnus Ivan due to a personal and family history of breast cancer.  Jennifer Kane presents to clinic today to discuss the possibility of a hereditary predisposition to cancer, genetic testing, and to further clarify her future cancer risks, as well as potential cancer risks for family members.   In March 2024, at the age of 5, Jennifer Kane was diagnosed with invasive ductal carcinoma of the right breast. The treatment plan lumpectomy and radiation.  Jennifer Kane reports being tested for BRCA mutations 10+ years ago at her GYN office and was negative.    CANCER HISTORY:  Oncology History  Malignant neoplasm of upper-inner quadrant of right breast in female, estrogen receptor positive (HCC)  08/07/2022 Initial Diagnosis   Mammogram detected suspicious mass in the right breast 12:30 position 1.1 cm (no evidence of malignancy in the area of concern in the LIQ) biopsy: Grade 2 IDC ER 95%, PR 100%, Ki67 10%, HER2 2+ by IHC negative by FISH ratio 1.09, copy #1.8   09/04/2022 Cancer Staging   Staging form: Breast, AJCC 8th Edition - Clinical: Stage IA (cT1c, cN0, cM0, G2, ER+, PR+, HER2-) - Signed by Serena Croissant, MD on 09/04/2022 Stage prefix: Initial diagnosis Histologic grading system: 3 grade system      RISK FACTORS:  Menarche was at age 88.  First live birth at age N/A.  OCP use for approximately  5-6  years.  Ovaries intact: yes.  Hysterectomy: no.  Menopausal status:  premenopausal.  HRT use: 0 years. Colonoscopy: no; not examined. Mammogram within the last year: yes. Number of breast biopsies: 1. Up to date with pelvic exams: yes. Any excessive radiation exposure in the past: no  Past Medical History:  Diagnosis Date   Anxiety    Asthma    Breast cancer (HCC) 08/07/2022   Diabetes (HCC)    Dysrhythmia    Hx of Irregular Heartbeat   Family history of brain cancer    Family history of breast cancer    Family history of ovarian cancer    GERD (gastroesophageal reflux disease)    History of panic attacks    History of tachycardia    Details not clear, reported abnormal tilt table test at age 31, on chronic low-dose beta-blocker   PONV (postoperative nausea and vomiting)     Past Surgical History:  Procedure Laterality Date   BREAST BIOPSY Right 08/07/2022   [place] RT BREAST BX W LOC DEV 1ST LESION IMG BX SPEC US GUIDE 08/07/2022 [facility]   BREAST BIOPSY  09/04/2022   [place] RT RADIOACTIVE SEED LOC 09/04/2022 [facility]   BREAST LUMPECTOMY WITH RADIOACTIVE SEED AND SENTINEL LYMPH NODE BIOPSY Right 09/06/2022   Procedure: RIGHT BREAST LUMPECTOMY WITH RADIOACTIVE SEED AND SENTINEL LYMPH NODE BIOPSY;  Surgeon: Abigail Miyamoto, MD;  Location: [facility] OR;  Service: General;  Laterality: Right;   CHOLECYSTECTOMY N/A 05/01/2018   Procedure: LAPAROSCOPIC CHOLECYSTECTOMY;  Surgeon: Rodman Pickle, MD;  Location: [facility];  Service: General;  Laterality: N/A;   KNEE ARTHROSCOPY  1989   No previous surgeries     WISDOM TOOTH EXTRACTION      Social History   Socioeconomic History   Marital status: Married    Spouse name: Not on file   Number of children: Not on file   Years of education: Not on file   Highest education level: Not on file  Occupational History   Not on file  Tobacco Use   Smoking status: Never   Smokeless tobacco: Never  Vaping Use   Vaping Use: Never used  Substance and Sexual Activity   Alcohol use: Yes     Comment: rarely   Drug use: No   Sexual activity: Not on file  Other Topics Concern   Not on file  Social History Narrative   Not on file   Social Determinants of Health   Financial Resource Strain: Not on file  Food Insecurity: Not on file  Transportation Needs: Not on file  Physical Activity: Not on file  Stress: Not on file  Social Connections: Not on file     FAMILY HISTORY:  We obtained a detailed, 4-generation family history.  Significant diagnoses are listed below: Family History  Problem Relation Age of Onset   Breast cancer Mother 35   CAD Father    Ovarian cancer Maternal Aunt 40       d. 39   Breast cancer Paternal Aunt 31   Lung cancer Paternal Aunt        non-smoker   Stroke Maternal Grandmother    Stroke Maternal Grandfather    Breast cancer Cousin 15       second cancer at 27; Maternal 1st Cousin   Brain cancer Cousin 32       optic nerve tumor   Brain cancer Nephew 11       glioblastoma     The patient does not have children.  She has two full brothers and one maternal half brother who are cancer free. One brother has a son who developed a glioblastoma at age 81. Both parents are living.  The patient's father does not have cancer.  He has two sisters and a brother.  One sister had breast cancer at 15 and another sister died of lung cancer.  His brother's son died of an optic nerve cancer.  The paternal grandparents are decease from non-cancer related issues.  The patient's mother was diagnosed with breast cancer at 50.  She had two sisters. One had ovarian cancer at 6, and one sister had a daughter with bilateral breast cancer at 53 and 30.  The maternal grandparents died of strokes.  Jennifer Kane is unaware of previous family history of genetic testing for hereditary cancer risks. Patient's maternal ancestors are of Caucasian descent, and paternal ancestors are of Caucasian and Native American descent. There is no reported Ashkenazi Jewish ancestry. There  is no known consanguinity.  GENETIC COUNSELING ASSESSMENT: Jennifer Kane is a 47 y.o. female with a personal and family history of breast cancer which is somewhat suggestive of a hereditary cancer syndrome and predisposition to cancer given the combination of cancer, two primary cancers and young ages of onset. We, therefore, discussed and recommended the following at today's visit.   DISCUSSION: We discussed that, in general, most cancer is not inherited in families, but instead is sporadic or familial. Sporadic cancers occur by chance and typically happen at older ages (>50 years) as  this type of cancer is caused by genetic changes acquired during an individual's lifetime. Some families have more cancers than would be expected by chance; however, the ages or types of cancer are not consistent with a known genetic mutation or known genetic mutations have been ruled out. This type of familial cancer is thought to be due to a combination of multiple genetic, environmental, hormonal, and lifestyle factors. While this combination of factors likely increases the risk of cancer, the exact source of this risk is not currently identifiable or testable.  We discussed that 5 - 10% of breast cancer is hereditary, with most cases associated with BRCA mutations.  The patient reports that she tested negative for BRCA mutations 10+ years ago.  We explained that testing has been improved and can now identify mutations within the BRCA mutations that were missed 10+ years ago.  There are other genes that can be associated with hereditary breast cancer syndromes.  These include ATM, CHEK2 and PALB2.  We discussed that testing is beneficial for several reasons including knowing how to follow individuals after completing their treatment, identifying whether potential treatment options such as PARP inhibitors would be beneficial, and understand if other family members could be at risk for cancer and allow them to undergo genetic  testing.   We reviewed the characteristics, features and inheritance patterns of hereditary cancer syndromes. We also discussed genetic testing, including the appropriate family members to test, the process of testing, insurance coverage and turn-around-time for results. We discussed the implications of a negative, positive, carrier and/or variant of uncertain significant result. Jennifer Kane  was offered a common hereditary cancer panel (47 genes) and an expanded pan-cancer panel (77 genes). Jennifer Kane was informed of the benefits and limitations of each panel, including that expanded pan-cancer panels contain genes that do not have clear management guidelines at this point in time.  We also discussed that as the number of genes included on a panel increases, the chances of variants of uncertain significance increases. Jennifer Kane decided to pursue genetic testing for the Multi-Cancer+RNA gene panel.   The Multi-Cancer + RNA Panel offered by [facility] includes sequencing and/or deletion/duplication analysis of the following 70 genes:  AIP*, ALK, APC*, ATM*, AXIN2*, BAP1*, BARD1*, BLM*, BMPR1A*, BRCA1*, BRCA2*, BRIP1*, CDC73*, CDH1*, CDK4, CDKN1B*, CDKN2A, CHEK2*, CTNNA1*, DICER1*, EPCAM (del/dup only), EGFR, FH*, FLCN*, GREM1 (promoter dup only), HOXB13, KIT, LZTR1, MAX*, MBD4, MEN1*, MET, MITF, MLH1*, MSH2*, MSH3*, MSH6*, MUTYH*, NF1*, NF2*, NTHL1*, PALB2*, PDGFRA, PMS2*, POLD1*, POLE*, POT1*, PRKAR1A*, PTCH1*, PTEN*, RAD51C*, RAD51D*, RB1*, RET, SDHA* (sequencing only), SDHAF2*, SDHB*, SDHC*, SDHD*, SMAD4*, SMARCA4*, SMARCB1*, SMARCE1*, STK11*, SUFU*, TMEM127*, TP53*, TSC1*, TSC2*, VHL*. RNA analysis is performed for * genes.   Based on Jennifer Kane's personal and family history of cancer, she meets medical criteria for genetic testing. Despite that she meets criteria, she may still have an out of pocket cost. We discussed that if her out of pocket cost for testing is over $100, the laboratory will call and  confirm whether she wants to proceed with testing.  If the out of pocket cost of testing is less than $100 she will be billed by the genetic testing laboratory.   We discussed that some people do not want to undergo genetic testing due to fear of genetic discrimination.  The Genetic Information Nondiscrimination Act (GINA) was signed into federal law in 2008. GINA prohibits health insurers and most employers from discriminating against individuals based on genetic information (including the results of genetic tests  and family history information). According to GINA, health insurance companies cannot consider genetic information to be a preexisting condition, nor can they use it to make decisions regarding coverage or rates. GINA also makes it illegal for most employers to use genetic information in making decisions about hiring, firing, promotion, or terms of employment. It is important to note that GINA does not offer protections for life insurance, disability insurance, or long-term care insurance. GINA does not apply to those in the [employer], those who work for companies with less than 15 employees, and new life insurance or long-term disability insurance policies.  Health status due to a cancer diagnosis is not protected under GINA. More information about GINA can be found by visiting [email or web address].   PLAN: After considering the risks, benefits, and limitations, Jennifer Kane provided informed consent to pursue genetic testing and the blood sample was [facility] analysis of the Multi-Cancer+RNA. Results should be available within approximately 2-3 weeks' time, at which point they will be disclosed by telephone to Jennifer Kane, as will any additional recommendations warranted by these results. Jennifer Kane will receive a summary of her genetic counseling visit and a copy of her results once available. This information will also be available in Epic.   Lastly, we encouraged Jennifer Kane to  remain in contact with cancer genetics annually so that we can continuously update the family history and inform her of any changes in cancer genetics and testing that may be of benefit for this family.   Jennifer Kane questions were answered to her satisfaction today. Our contact information was provided should additional questions or concerns arise. Thank you for the referral and allowing [place] to share in the care of your patient.   Jennifer Demchak P. Lowell Guitar, MS, [facility] Licensed, Patent attorney [email or web address] phone: [phone]  The patient was seen for a total of 35 minutes in face-to-face genetic counseling.  The patient was seen alone. Drs. Meliton Rattan, and/or [place] were available for questions, if needed..    _______________________________________________________________________ For Office Staff:  Number of people involved in session: 1 Was an Intern/ student involved with case: no

## 2022-09-24 ENCOUNTER — Encounter: Payer: Self-pay | Admitting: Genetic Counselor

## 2022-09-24 ENCOUNTER — Ambulatory Visit: Payer: Self-pay | Admitting: Genetic Counselor

## 2022-09-24 ENCOUNTER — Telehealth: Payer: Self-pay | Admitting: Genetic Counselor

## 2022-09-24 DIAGNOSIS — Z1379 Encounter for other screening for genetic and chromosomal anomalies: Secondary | ICD-10-CM

## 2022-09-24 NOTE — Progress Notes (Signed)
Patient Care Team: Sheela Stack as PCP - General (Physician Assistant) Selinda Flavin, MD (Family Medicine) Donnelly Angelica, RN as Oncology Nurse Navigator Pershing Proud, RN as Oncology Nurse Navigator Serena Croissant, MD as Consulting Physician (Hematology and Oncology) Abigail Miyamoto, MD as Consulting Physician (General Surgery) Dorothy Puffer, MD as Consulting Physician (Radiation Oncology)  DIAGNOSIS: No diagnosis found.  SUMMARY OF ONCOLOGIC HISTORY: Oncology History  Malignant neoplasm of upper-inner quadrant of right breast in female, estrogen receptor positive (HCC)  08/07/2022 Initial Diagnosis   Mammogram detected suspicious mass in the right breast 12:30 position 1.1 cm (no evidence of malignancy in the area of concern in the LIQ) biopsy: Grade 2 IDC ER 95%, PR 100%, Ki67 10%, HER2 2+ by IHC negative by FISH ratio 1.09, copy #1.8   09/04/2022 Cancer Staging   Staging form: Breast, AJCC 8th Edition - Clinical: Stage IA (cT1c, cN0, cM0, G2, ER+, PR+, HER2-) - Signed by Serena Croissant, MD on 09/04/2022 Stage prefix: Initial diagnosis Histologic grading system: 3 grade system   09/23/2022 Genetic Testing   Negative genetic testing on the Multi-cancer+RNAinsight panel.  The report date is Sep 23, 2022.  The Multi-Cancer + RNA Panel offered by [facility] includes sequencing and/or deletion/duplication analysis of the following 70 genes:  AIP*, ALK, APC*, ATM*, AXIN2*, BAP1*, BARD1*, BLM*, BMPR1A*, BRCA1*, BRCA2*, BRIP1*, CDC73*, CDH1*, CDK4, CDKN1B*, CDKN2A, CHEK2*, CTNNA1*, DICER1*, EPCAM (del/dup only), EGFR, FH*, FLCN*, GREM1 (promoter dup only), HOXB13, KIT, LZTR1, MAX*, MBD4, MEN1*, MET, MITF, MLH1*, MSH2*, MSH3*, MSH6*, MUTYH*, NF1*, NF2*, NTHL1*, PALB2*, PDGFRA, PMS2*, POLD1*, POLE*, POT1*, PRKAR1A*, PTCH1*, PTEN*, RAD51C*, RAD51D*, RB1*, RET, SDHA* (sequencing only), SDHAF2*, SDHB*, SDHC*, SDHD*, SMAD4*, SMARCA4*, SMARCB1*, SMARCE1*, STK11*, SUFU*, TMEM127*, TP53*,  TSC1*, TSC2*, VHL*. RNA analysis is performed for * genes.     CHIEF COMPLIANT: Follow-up after surgey  INTERVAL HISTORY: Jennifer Kane is a 47 y.o. female is here because of recent diagnosis of right breast cancer. She presents to the clinic for a follow-up.    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  MEDICATIONS:  Current Outpatient Medications  Medication Sig Dispense Refill   EPINEPHrine (EPIPEN 2-PAK) 0.3 mg/0.3 mL IJ SOAJ injection Inject 0.3 mg into the muscle once.     escitalopram (LEXAPRO) 10 MG tablet Take 5 mg by mouth at bedtime.     ibuprofen (ADVIL) 200 MG tablet Take 200 mg by mouth every 6 (six) hours as needed for moderate pain.     Semaglutide,0.25 or 0.5MG /DOS, (OZEMPIC, 0.25 OR 0.5 MG/DOSE,) 2 MG/1.5ML SOPN Inject 0.5 mg into the skin once a week.     traMADol (ULTRAM) 50 MG tablet Take 1 tablet (50 mg total) by mouth every 6 (six) hours as needed for moderate pain or severe pain. 25 tablet 0   No current facility-administered medications for this visit.    PHYSICAL EXAMINATION: ECOG PERFORMANCE STATUS: {CHL ONC ECOG PS:[phone]}  There were no vitals filed for this visit. There were no vitals filed for this visit.  BREAST:*** No palpable masses or nodules in either right or left breasts. No palpable axillary supraclavicular or infraclavicular adenopathy no breast tenderness or nipple discharge. (exam performed in the presence of a chaperone)  LABORATORY DATA:  I have reviewed the data as listed    Latest Ref Rng & Units 09/12/2022   10:26 AM 08/30/2022    2:57 PM 04/28/2018    9:40 AM  CMP  Glucose 70 - 99 mg/dL 782  956  99  BUN 6 - 20 mg/dL 11  11  10    Creatinine 0.44 - 1.00 mg/dL 9.52  8.41  3.24   Sodium 135 - 145 mmol/L 137  136  139   Potassium 3.5 - 5.1 mmol/L 4.1  3.6  4.1   Chloride 98 - 111 mmol/L 105  103  107   CO2 22 - 32 mmol/L 29  26  25    Calcium 8.9 - 10.3 mg/dL 8.8  8.7  8.9   Total Protein 6.5 - 8.1 g/dL 7.1     Total  Bilirubin 0.3 - 1.2 mg/dL 0.4     Alkaline Phos 38 - 126 U/L 69     AST 15 - 41 U/L 25     ALT 0 - 44 U/L 39       Lab Results  Component Value Date   WBC 8.1 09/12/2022   HGB 13.8 09/12/2022   HCT 40.6 09/12/2022   MCV 94.6 09/12/2022   PLT 298 09/12/2022   NEUTROABS 5.0 09/12/2022    ASSESSMENT & PLAN:  No problem-specific Assessment & Plan notes found for this encounter.    No orders of the defined types were placed in this encounter.  The patient has a good understanding of the overall plan. she agrees with it. she will call with any problems that may develop before the next visit here. Total time spent: 30 mins including face to face time and time spent for planning, charting and co-ordination of care   Sherlyn Lick, CMA 09/24/22    I Janan Ridge am acting as a Neurosurgeon for [employer]  ***

## 2022-09-24 NOTE — Progress Notes (Signed)
HPI:  Ms. Vicker was previously seen in the [facility] due to a personal and family history of cancer and concerns regarding a hereditary predisposition to cancer. Please refer to our prior cancer genetics clinic note for more information regarding our discussion, assessment and recommendations, at the time. Ms. Tal recent genetic test results were disclosed to her, as were recommendations warranted by these results. These results and recommendations are discussed in more detail below.  CANCER HISTORY:  Oncology History  Malignant neoplasm of upper-inner quadrant of right breast in female, estrogen receptor positive (HCC)  08/07/2022 Initial Diagnosis   Mammogram detected suspicious mass in the right breast 12:30 position 1.1 cm (no evidence of malignancy in the area of concern in the LIQ) biopsy: Grade 2 IDC ER 95%, PR 100%, Ki67 10%, HER2 2+ by IHC negative by FISH ratio 1.09, copy #1.8   09/04/2022 Cancer Staging   Staging form: Breast, AJCC 8th Edition - Clinical: Stage IA (cT1c, cN0, cM0, G2, ER+, PR+, HER2-) - Signed by Serena Croissant, MD on 09/04/2022 Stage prefix: Initial diagnosis Histologic grading system: 3 grade system   09/23/2022 Genetic Testing   Negative genetic testing on the Multi-cancer+RNAinsight panel.  The report date is Sep 23, 2022.  The Multi-Cancer + RNA Panel offered by [facility] includes sequencing and/or deletion/duplication analysis of the following 70 genes:  AIP*, ALK, APC*, ATM*, AXIN2*, BAP1*, BARD1*, BLM*, BMPR1A*, BRCA1*, BRCA2*, BRIP1*, CDC73*, CDH1*, CDK4, CDKN1B*, CDKN2A, CHEK2*, CTNNA1*, DICER1*, EPCAM (del/dup only), EGFR, FH*, FLCN*, GREM1 (promoter dup only), HOXB13, KIT, LZTR1, MAX*, MBD4, MEN1*, MET, MITF, MLH1*, MSH2*, MSH3*, MSH6*, MUTYH*, NF1*, NF2*, NTHL1*, PALB2*, PDGFRA, PMS2*, POLD1*, POLE*, POT1*, PRKAR1A*, PTCH1*, PTEN*, RAD51C*, RAD51D*, RB1*, RET, SDHA* (sequencing only), SDHAF2*, SDHB*, SDHC*, SDHD*, SMAD4*, SMARCA4*,  SMARCB1*, SMARCE1*, STK11*, SUFU*, TMEM127*, TP53*, TSC1*, TSC2*, VHL*. RNA analysis is performed for * genes.     FAMILY HISTORY:  We obtained a detailed, 4-generation family history.  Significant diagnoses are listed below: Family History  Problem Relation Age of Onset   Breast cancer Mother 59   CAD Father    Ovarian cancer Maternal Aunt 40       d. 49   Breast cancer Paternal Aunt 65   Lung cancer Paternal Aunt        non-smoker   Stroke Maternal Grandmother    Stroke Maternal Grandfather    Breast cancer Cousin 19       second cancer at 85; Maternal 1st Cousin   Brain cancer Cousin 32       optic nerve tumor   Brain cancer Nephew 11       glioblastoma       The patient does not have children.  She has two full brothers and one maternal half brother who are cancer free. One brother has a son who developed a glioblastoma at age 70. Both parents are living.   The patient's father does not have cancer.  He has two sisters and a brother.  One sister had breast cancer at 89 and another sister died of lung cancer.  His brother's son died of an optic nerve cancer.  The paternal grandparents are decease from non-cancer related issues.   The patient's mother was diagnosed with breast cancer at 25.  She had two sisters. One had ovarian cancer at 9, and one sister had a daughter with bilateral breast cancer at 20 and 18.  The maternal grandparents died of strokes.   Ms. Syfrett is unaware of  previous family history of genetic testing for hereditary cancer risks. Patient's maternal ancestors are of Caucasian descent, and paternal ancestors are of Caucasian and Native American descent. There is no reported Ashkenazi Jewish ancestry. There is no known consanguinity.  GENETIC TEST RESULTS: Genetic testing reported out on Sep 23, 2022 through the Multi-cancer + RNA cancer panel found no pathogenic mutations. The Multi-Cancer + RNA Panel offered by [facility] includes sequencing and/or  deletion/duplication analysis of the following 70 genes:  AIP*, ALK, APC*, ATM*, AXIN2*, BAP1*, BARD1*, BLM*, BMPR1A*, BRCA1*, BRCA2*, BRIP1*, CDC73*, CDH1*, CDK4, CDKN1B*, CDKN2A, CHEK2*, CTNNA1*, DICER1*, EPCAM (del/dup only), EGFR, FH*, FLCN*, GREM1 (promoter dup only), HOXB13, KIT, LZTR1, MAX*, MBD4, MEN1*, MET, MITF, MLH1*, MSH2*, MSH3*, MSH6*, MUTYH*, NF1*, NF2*, NTHL1*, PALB2*, PDGFRA, PMS2*, POLD1*, POLE*, POT1*, PRKAR1A*, PTCH1*, PTEN*, RAD51C*, RAD51D*, RB1*, RET, SDHA* (sequencing only), SDHAF2*, SDHB*, SDHC*, SDHD*, SMAD4*, SMARCA4*, SMARCB1*, SMARCE1*, STK11*, SUFU*, TMEM127*, TP53*, TSC1*, TSC2*, VHL*. RNA analysis is performed for * genes. The test report has been scanned into EPIC and is located under the Molecular Pathology section of the Results Review tab.  A portion of the result report is included below for reference.     We discussed with Ms. Ho that because current genetic testing is not perfect, it is possible there may be a gene mutation in one of these genes that current testing cannot detect, but that chance is small.  We also discussed, that there could be another gene that has not yet been discovered, or that we have not yet tested, that is responsible for the cancer diagnoses in the family. It is also possible there is a hereditary cause for the cancer in the family that Ms. Birts did not inherit and therefore was not identified in her testing.  Therefore, it is important to remain in touch with cancer genetics in the future so that we can continue to offer Ms. Hanover the most up to date genetic testing.   ADDITIONAL GENETIC TESTING: We discussed with Ms. Plaisance that her genetic testing was fairly extensive.  If there are genes identified to increase cancer risk that can be analyzed in the future, we would be happy to discuss and coordinate this testing at that time.    CANCER SCREENING RECOMMENDATIONS: Ms. Dutt test result is considered negative (normal).  This means  that we have not identified a hereditary cause for her personal and family history of cancer at this time. Most cancers happen by chance and this negative test suggests that her cancer may fall into this category.    Possible reasons for Ms. Burkman's negative genetic test include:  1. There may be a gene mutation in one of these genes that current testing methods cannot detect but that chance is small.  2. There could be another gene that has not yet been discovered, or that we have not yet tested, that is responsible for the cancer diagnoses in the family.  3.  There may be no hereditary risk for cancer in the family. The cancers in Ms. Blacksher and/or her family may be sporadic/familial or due to other genetic and environmental factors. 4. It is also possible there is a hereditary cause for the cancer in the family that Ms. Gotay did not inherit.  Therefore, it is recommended she continue to follow the cancer management and screening guidelines provided by her oncology and primary healthcare provider. An individual's cancer risk and medical management are not determined by genetic test results alone. Overall cancer risk assessment incorporates additional  factors, including personal medical history, family history, and any available genetic information that may result in a personalized plan for cancer prevention and surveillance  RECOMMENDATIONS FOR FAMILY MEMBERS:  Individuals in this family might be at some increased risk of developing cancer, over the general population risk, simply due to the family history of cancer.  We recommended women in this family have a yearly mammogram beginning at age 90, or 83 years younger than the earliest onset of cancer, an annual clinical breast exam, and perform monthly breast self-exams. Women in this family should also have a gynecological exam as recommended by their primary provider. All family members should be referred for colonoscopy starting at age  75.  FOLLOW-UP: Lastly, we discussed with Ms. Ginn that cancer genetics is a rapidly advancing field and it is possible that new genetic tests will be appropriate for her and/or her family members in the future. We encouraged her to remain in contact with cancer genetics on an annual basis so we can update her personal and family histories and let her know of advances in cancer genetics that may benefit this family.   Our contact number was provided. Ms. Mahood questions were answered to her satisfaction, and she knows she is welcome to call us at anytime with additional questions or concerns.   Maylon Cos, MS, [facility] Licensed, Certified Genetic Counselor [email or web address]

## 2022-09-24 NOTE — Telephone Encounter (Signed)
Revealed negative genetic testing.  Discussed that we do not know why she has breast cancer or why there is cancer in the family. It could be due to a different gene that we are not testing, or maybe our current technology may not be able to pick something up.  It will be important for her to keep in contact with genetics to keep up with whether additional testing may be needed. 

## 2022-09-25 ENCOUNTER — Other Ambulatory Visit: Payer: Self-pay

## 2022-09-25 ENCOUNTER — Inpatient Hospital Stay (HOSPITAL_BASED_OUTPATIENT_CLINIC_OR_DEPARTMENT_OTHER): Payer: BC Managed Care – PPO | Admitting: Hematology and Oncology

## 2022-09-25 VITALS — BP 119/72 | HR 65 | Temp 97.6°F | Resp 18 | Ht 63.0 in | Wt 229.2 lb

## 2022-09-25 DIAGNOSIS — C50211 Malignant neoplasm of upper-inner quadrant of right female breast: Secondary | ICD-10-CM

## 2022-09-25 DIAGNOSIS — Z170 Estrogen receptor positive status [ER+]: Secondary | ICD-10-CM

## 2022-09-25 NOTE — Assessment & Plan Note (Addendum)
09/06/2022:Right lumpectomy: Grade 2 IDC 1.5 cm margins -0/7 lymph nodes negative, ER 95%, PR 100%, HER2 negative, Ki-67 10%  Pathology counseling: I discussed the final pathology report of the patient provided  a copy of this report. I discussed the margins as well as lymph node surgeries. We also discussed the final staging along with previously performed ER/PR and HER-2/neu testing.  Treatment plan: Oncotype DX recurrence score 20 (risk of distant) and 9 years: 6%) no benefit of chemo Adjuvant radiation therapy followed by Adjuvant antiestrogen therapy  Return to clinic after radiation complete

## 2022-09-27 ENCOUNTER — Encounter: Payer: Self-pay | Admitting: Hematology and Oncology

## 2022-09-28 ENCOUNTER — Encounter: Payer: Self-pay | Admitting: *Deleted

## 2022-10-03 ENCOUNTER — Encounter: Payer: Self-pay | Admitting: Rehabilitation

## 2022-10-03 ENCOUNTER — Ambulatory Visit: Payer: BC Managed Care – PPO | Attending: Surgery | Admitting: Rehabilitation

## 2022-10-03 DIAGNOSIS — C50211 Malignant neoplasm of upper-inner quadrant of right female breast: Secondary | ICD-10-CM | POA: Insufficient documentation

## 2022-10-03 DIAGNOSIS — R293 Abnormal posture: Secondary | ICD-10-CM | POA: Diagnosis present

## 2022-10-03 DIAGNOSIS — Z483 Aftercare following surgery for neoplasm: Secondary | ICD-10-CM | POA: Insufficient documentation

## 2022-10-03 DIAGNOSIS — Z170 Estrogen receptor positive status [ER+]: Secondary | ICD-10-CM | POA: Insufficient documentation

## 2022-10-03 DIAGNOSIS — Z9189 Other specified personal risk factors, not elsewhere classified: Secondary | ICD-10-CM | POA: Insufficient documentation

## 2022-10-03 NOTE — Patient Instructions (Signed)
Abbreviated Massage: 5-10 circles at collarbones, deep breathing, circles in both armpits, skin stretch from Rt armpit to left armpit

## 2022-10-03 NOTE — Therapy (Signed)
OUTPATIENT PHYSICAL THERAPY BREAST CANCER POST OP FOLLOW UP   Patient Name: Jennifer Kane MRN: [id number] DOB:25-Jul-1975, 47 y.o., female Today's Date: 10/03/2022  END OF SESSION:  PT End of Session - 10/03/22 1556     Visit Number 2    Number of Visits 6    Date for PT Re-Evaluation 10/31/22    PT Start Time 1600    PT Stop Time 1638    PT Time Calculation (min) 38 min    Activity Tolerance Patient tolerated treatment well    Behavior During Therapy [facility] for tasks assessed/performed             Past Medical History:  Diagnosis Date   Anxiety    Asthma    Breast cancer (HCC) 08/07/2022   Diabetes (HCC)    Dysrhythmia    Hx of Irregular Heartbeat   Family history of brain cancer    Family history of breast cancer    Family history of ovarian cancer    GERD (gastroesophageal reflux disease)    History of panic attacks    History of tachycardia    Details not clear, reported abnormal tilt table test at age 45, on chronic low-dose beta-blocker   PONV (postoperative nausea and vomiting)    Past Surgical History:  Procedure Laterality Date   BREAST BIOPSY Right 08/07/2022   [place] RT BREAST BX W LOC DEV 1ST LESION IMG BX SPEC US GUIDE 08/07/2022 [facility]   BREAST BIOPSY  09/04/2022   [place] RT RADIOACTIVE SEED LOC 09/04/2022 [facility]   BREAST LUMPECTOMY WITH RADIOACTIVE SEED AND SENTINEL LYMPH NODE BIOPSY Right 09/06/2022   Procedure: RIGHT BREAST LUMPECTOMY WITH RADIOACTIVE SEED AND SENTINEL LYMPH NODE BIOPSY;  Surgeon: Abigail Miyamoto, MD;  Location: [facility] OR;  Service: General;  Laterality: Right;   CHOLECYSTECTOMY N/A 05/01/2018   Procedure: LAPAROSCOPIC CHOLECYSTECTOMY;  Surgeon: Rodman Pickle, MD;  Location: [facility];  Service: General;  Laterality: N/A;   KNEE ARTHROSCOPY  1989   No previous surgeries     WISDOM TOOTH EXTRACTION     Patient Active Problem List   Diagnosis Date Noted   Genetic testing 09/24/2022   Family history of breast  cancer 09/12/2022   Family history of ovarian cancer 09/12/2022   Family history of brain cancer 09/12/2022   Malignant neoplasm of upper-inner quadrant of right breast in female, estrogen receptor positive (HCC) 08/24/2022   Moderate persistent asthma, uncomplicated 01/31/2022   Seasonal and perennial allergic rhinitis 01/31/2022   Anaphylactic shock due to adverse food reaction 01/31/2022    REFERRING PROVIDER: Dr. Magnus Ivan  REFERRING DIAG: Rt breast cancer  THERAPY DIAG:  Malignant neoplasm of upper-inner quadrant of right breast in female, estrogen receptor positive (HCC)  Abnormal posture  Aftercare following surgery for neoplasm  At risk for lymphedema  Rationale for Evaluation and Treatment: Rehabilitation  ONSET DATE: 08/13/22  SUBJECTIVE:  SUBJECTIVE STATEMENT: I am very sore in the armpit.  I am noticing a catch in the armpit as I lift out to the side.  Wearing the compression bra.    PERTINENT HISTORY:  Patient was diagnosed with right grade 2 IDC. It measures 1.1 cm. It is ER/PR positive with a Ki67 of 10%. Rt breast lumpectomy and SLNB on 09/06/22 with 7 negative nodes removed No benefit to chemotherapy and pt will have radiation and antiestrogen therapy.  Other hx includes asthma, DM. Height: 5'3"   PATIENT GOALS:  Reassess how my recovery is going related to arm function, pain, and swelling.  PAIN:  Are you having pain? Yes: NPRS scale: 3/10 Pain location: Rt axilla  Pain description: soreness Aggravating factors: nothing Relieving factors: nothing   PRECAUTIONS: Recent Surgery, right UE Lymphedema risk  ACTIVITY LEVEL / LEISURE: I am back to all activities     OBJECTIVE:   PATIENT SURVEYS:  QUICK DASH: 18% from 2%  OBSERVATIONS: Increased swelling in the Rt axilla and  supraclavicular region as well as lateral trunk region under axilla, incisions healed, mag trace visible.   POSTURE:  Rounded shoulders   LYMPHEDEMA ASSESSMENT:  UPPER EXTREMITY AROM/PROM:   A/PROM RIGHT   eval   10/03/22  Shoulder extension 45 45  Shoulder flexion 170 170 - pn  Shoulder abduction 165 170 - pn  Shoulder internal rotation     Shoulder external rotation 90 90                          (Blank rows = not tested)   A/PROM LEFT   eval  Shoulder extension 47  Shoulder flexion 160  Shoulder abduction 165  Shoulder internal rotation    Shoulder external rotation 95                          (Blank rows = not tested)   CERVICAL AROM: All within normal limits:    UPPER EXTREMITY STRENGTH: 5/5 - pain top of the shoulder with resisted flexion and abduction   LYMPHEDEMA ASSESSMENTS:    LANDMARK RIGHT   eval 10/03/22  10 cm proximal to olecranon process 38 39  Olecranon process 31 31  10  cm proximal to ulnar styloid process 27.5 27.5  Just proximal to ulnar styloid process 18.5 19  Across hand at thumb web space 20.5 21.5  At base of 2nd digit 7.5 7.5  (Blank rows = not tested)   LANDMARK LEFT   eval  10 cm proximal to olecranon process 35.5  Olecranon process 30  10 cm proximal to ulnar styloid process 26  Just proximal to ulnar styloid process 19  Across hand at thumb web space 21  At base of 2nd digit 7.2  (Blank rows = not tested)  TODAY"S TREATMENT: 10/03/22 EVAL, education on a brief decongestion sequence to start per HEP section Gave rectangle of foam for side and front of bra   PATIENT EDUCATION:  Education details: post op per below, abbreviated MLD, POC Person educated: Patient Education method: Programmer, multimedia, Facilities manager, Verbal cues, and Handouts Education comprehension: verbalized understanding and returned demonstration  HOME EXERCISE PROGRAM: Reviewed previously given post op HEP Abbreviated MLD: 5-10 circles at collarbones, deep  breathing, circles in both armpits, skin stretch from Rt armpit to left armpit  ASSESSMENT:  CLINICAL IMPRESSION: Pt returns post Rt lumpectomy with aggravation of impingement type pain that she has had previously  when reaching into flexion and abduction as well as with resisted flexion and abduction along with lymphatic congestion in the Rt axilla and clavicular region. We will do a few visits to optimize shoulder mobility and decrease edema prior to radiation starting.    Pt will benefit from skilled therapeutic intervention to improve on the following deficits: Decreased knowledge of precautions, impaired UE functional use, pain, decreased ROM, postural dysfunction.   PT treatment/interventions: ADL/Self care home management, Therapeutic exercises, Therapeutic activity, Patient/Family education, Self Care, Joint mobilization, Manual therapy, and Re-evaluation   GOALS: Goals reviewed with patient? Yes  LONG TERM GOALS:  (STG=LTG)  GOALS Name Target Date  Goal status  1 Pt will demonstrate she has regained full shoulder ROM and function post operatively compared to baselines.  Baseline: 10/03/22 INITIAL  2 Pt will be ind MLD for the Rt axillary and supraclavicular region 10/31/22 INITIAL  3 Pt will be ind with exercises to decrease impingement like symptoms at the shoulder  10/31/22 INITIAL    PLAN:  PT FREQUENCY/DURATION: 1x per week x 4 weeks  PLAN FOR NEXT SESSION: what is status of swelling? Any better? Perform MLD for the Rt upper quadrant (breast?), teach pt full sequence or more steps as needed, joint mob- inf and post, impingement TE as tolerated: supine or seated scap avoiding pain? + row and pro/ret   [facility]  [address]  [place] [address]  [phone]  After Breast Cancer Class It is recommended you attend the ABC class to be educated on lymphedema risk reduction. This class is free of charge and lasts for 1 hour. It is a  1-time class. You will need to download the TEAMS app either on your phone or computer. We will send you a link the night before or the morning of the class. You should be able to click on that link to join the class. This is not a confidential class. You don't have to turn your camera on, but other participants may be able to see your email address.  Scar massage You can begin gentle scar massage to you incision sites. Gently place one hand on the incision and move the skin (without sliding on the skin) in various directions. Do this for a few minutes and then you can gently massage either coconut oil or vitamin E cream into the scars.  Compression garment You should continue wearing your compression bra until you feel like you no longer have swelling.  Home exercise Program Continue doing the exercises you were given until you feel like you can do them without feeling any tightness at the end.   Walking Program Studies show that 30 minutes of walking per day (fast enough to elevate your heart rate) can significantly reduce the risk of a cancer recurrence. If you can't walk due to other medical reasons, we encourage you to find another activity you could do (like a stationary bike or water exercise).  Posture After breast cancer surgery, people frequently sit with rounded shoulders posture because it puts their incisions on slack and feels better. If you sit like this and scar tissue forms in that position, you can become very tight and have pain sitting or standing with good posture. Try to be aware of your posture and sit and stand up tall to heal properly.  Follow up PT: It is recommended you return every 3 months for the first 3 years following surgery to be assessed on the SOZO machine  for an L-Dex score. This helps prevent clinically significant lymphedema in 95% of patients. These follow up screens are 10 minute appointments that you are not billed for.  Idamae Lusher, PT 10/03/2022, 4:39  PM

## 2022-10-10 ENCOUNTER — Ambulatory Visit: Payer: BC Managed Care – PPO | Admitting: Physical Therapy

## 2022-10-15 ENCOUNTER — Telehealth: Payer: Self-pay

## 2022-10-15 NOTE — Progress Notes (Signed)
Radiation Oncology         [phone] ________________________________  Name: Jennifer Kane        MRN: [id number]  Date of Service: 10/18/2022 DOB: Feb 22, 1976  YN:[id number], Helane Rima, PA-C  Abigail Miyamoto, MD     REFERRING PHYSICIAN: Abigail Miyamoto, MD   DIAGNOSIS: The encounter diagnosis was Malignant neoplasm of upper-inner quadrant of right breast in female, estrogen receptor positive (HCC).   HISTORY OF PRESENT ILLNESS: Jennifer Kane is a 47 y.o. female with a  diagnosis of right breast cancer.  The patient palpated an abnormality in her right breast and was seen for diagnostic imaging that identified a mass in the lower right breast.  By ultrasound this was located in the 12:30 position and measured 1.1 cm in greatest dimension.  There is no adenopathy noted in the axilla.  She underwent a biopsy on 08/07/2022 that showed a grade 2 invasive ductal carcinoma that was ER/PR positive.  HER2 was negative and Ki-67 was 10%.    Since her last visit, she underwent a right lumpectomy with sentinel lymph node biopsy on 09/06/2022 with Dr. Magnus Ivan. This showed a 1.5 cm grade 2 invasive ductal carcinoma. Her margins were negative, and 7 sampled nodes were negative for disease. She had oncotype Dx score of 20, and no systemic chemotherapy is recommended.     PREVIOUS RADIATION THERAPY: No   PAST MEDICAL HISTORY:  Past Medical History:  Diagnosis Date   Anxiety    Asthma    Breast cancer (HCC) 08/07/2022   Diabetes (HCC)    Dysrhythmia    Hx of Irregular Heartbeat   Family history of brain cancer    Family history of breast cancer    Family history of ovarian cancer    GERD (gastroesophageal reflux disease)    History of panic attacks    History of tachycardia    Details not clear, reported abnormal tilt table test at age 63, on chronic low-dose beta-blocker   PONV (postoperative nausea and vomiting)        PAST SURGICAL HISTORY: Past Surgical History:  Procedure  Laterality Date   BREAST BIOPSY Right 08/07/2022   [place] RT BREAST BX W LOC DEV 1ST LESION IMG BX SPEC US GUIDE 08/07/2022 [facility]   BREAST BIOPSY  09/04/2022   [place] RT RADIOACTIVE SEED LOC 09/04/2022 [facility]   BREAST LUMPECTOMY WITH RADIOACTIVE SEED AND SENTINEL LYMPH NODE BIOPSY Right 09/06/2022   Procedure: RIGHT BREAST LUMPECTOMY WITH RADIOACTIVE SEED AND SENTINEL LYMPH NODE BIOPSY;  Surgeon: Abigail Miyamoto, MD;  Location: [facility];  Service: General;  Laterality: Right;   CHOLECYSTECTOMY N/A 05/01/2018   Procedure: LAPAROSCOPIC CHOLECYSTECTOMY;  Surgeon: Rodman Pickle, MD;  Location: [facility];  Service: General;  Laterality: N/A;   KNEE ARTHROSCOPY  1989   No previous surgeries     WISDOM TOOTH EXTRACTION       FAMILY HISTORY:  Family History  Problem Relation Age of Onset   Breast cancer Mother 43   CAD Father    Ovarian cancer Maternal Aunt 40       d. 66   Breast cancer Paternal Aunt 9   Lung cancer Paternal Aunt        non-smoker   Stroke Maternal Grandmother    Stroke Maternal Grandfather    Breast cancer Cousin 36       second cancer at 17; Maternal 1st Cousin   Brain cancer Cousin 41  optic nerve tumor   Brain cancer Nephew 11       glioblastoma     SOCIAL HISTORY:  reports that she has never smoked. She has never used smokeless tobacco. She reports current alcohol use. She reports that she does not use drugs.  The patient is married and [place]  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   ALLERGIES: Meat [alpha-gal]   MEDICATIONS:  Current Outpatient Medications  Medication Sig Dispense Refill   cetirizine (ZYRTEC) 5 MG chewable tablet Chew 5 mg by mouth daily.     Semaglutide,0.25 or 0.5MG /DOS, (OZEMPIC, 0.25 OR 0.5 MG/DOSE,) 2 MG/1.5ML SOPN Inject 0.5 mg into the skin once a week.     EPINEPHrine (EPIPEN 2-PAK) 0.3 mg/0.3 mL IJ SOAJ injection Inject 0.3 mg into the muscle once.      escitalopram (LEXAPRO) 10 MG tablet Take 5 mg by mouth at bedtime.     ibuprofen (ADVIL) 200 MG tablet Take 200 mg by mouth every 6 (six) hours as needed for moderate pain.     traMADol (ULTRAM) 50 MG tablet Take 1 tablet (50 mg total) by mouth every 6 (six) hours as needed for moderate pain or severe pain. (Patient not taking: Reported on 10/18/2022) 25 tablet 0   No current facility-administered medications for this encounter.     REVIEW OF SYSTEMS: On review of systems, the patient reports that she is doing well overall. She feels like she's healed nicely since surgery. No other complaints are verbalized.      PHYSICAL EXAM:  Wt Readings from Last 3 Encounters:  10/18/22 234 lb 9.6 oz (106.4 kg)  09/25/22 229 lb 3.2 oz (104 kg)  09/06/22 220 lb (99.8 kg)   Temp Readings from Last 3 Encounters:  10/18/22 97.9 F (36.6 C) (Temporal)  09/25/22 97.6 F (36.4 C) (Temporal)  09/06/22 (!) 97.2 F (36.2 C)   BP Readings from Last 3 Encounters:  10/18/22 102/79  09/25/22 119/72  09/06/22 117/75   Pulse Readings from Last 3 Encounters:  10/18/22 63  09/25/22 65  09/06/22 91    In general this is a well appearing caucasian female in no acute distress. She's alert and oriented x4 and appropriate throughout the examination. Cardiopulmonary assessment is negative for acute distress and she exhibits normal effort. Her right breast incision site is well healed without erythema, separation or drainage. There is mild induration noted without fluctuance. Similar findings are noted in the right axillary incision.     ECOG = 0  0 - Asymptomatic (Fully active, able to carry on all predisease activities without restriction)  1 - Symptomatic but completely ambulatory (Restricted in physically strenuous activity but ambulatory and able to carry out work of a light or sedentary nature. For example, light housework, office work)  2 - Symptomatic, <50% in bed during the day (Ambulatory and  capable of all self care but unable to carry out any work activities. Up and about more than 50% of waking hours)  3 - Symptomatic, >50% in bed, but not bedbound (Capable of only limited self-care, confined to bed or chair 50% or more of waking hours)  4 - Bedbound (Completely disabled. Cannot carry on any self-care. Totally confined to bed or chair)  5 - Death   Santiago Glad MM, Creech RH, Tormey DC, et al. [phone]). "Toxicity and response criteria of the [employer] [facility] [employer]". Am. Evlyn Clines. Oncol. 5 (6): 649-55    LABORATORY DATA:  Lab Results  Component Value Date   WBC 8.1 09/12/2022   HGB 13.8 09/12/2022   HCT 40.6 09/12/2022   MCV 94.6 09/12/2022   PLT 298 09/12/2022   Lab Results  Component Value Date   NA 137 09/12/2022   K 4.1 09/12/2022   CL 105 09/12/2022   CO2 29 09/12/2022   Lab Results  Component Value Date   ALT 39 09/12/2022   AST 25 09/12/2022   ALKPHOS 69 09/12/2022   BILITOT 0.4 09/12/2022      RADIOGRAPHY: No results found.     IMPRESSION/PLAN: 1. 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 Dr. Mitzi Hansen has reviewed her final pathology findings and today we reviewed the nature of early stage breast disease. She has done well surgically and  oncotype dx results do not support the need for chemotherapy. Dr. Mitzi Hansen recommends external radiotherapy to the breast  to reduce risks of local recurrence followed by antiestrogen therapy. We discussed the risks, benefits, short, and long term effects of radiotherapy, as well as the curative intent, and the patient is interested in proceeding. We reviewed the delivery and logistics of radiotherapy and Dr. Mitzi Hansen recommends up to 6 1/2  weeks of radiotherapy to the right breast, however she is aware that insurance may only be willing to cover a 4 week course and she is open to this range.  Written consent is obtained and placed in the chart, a copy was provided to the  patient. She will simulate today.  2. Contraceptive Counseling. The patient reports a normal menstrual cycle on 10/11/22. Her husband's vasectomy is her contraception. No pregnancy testing is needed.   In a visit lasting 45 minutes, greater than 50% of the time was spent face to face discussing the patient's condition, in preparation for the discussion, and coordinating the patient's care.       Osker Mason, [facility]     **Disclaimer: This note was dictated with voice recognition software. Similar sounding words can inadvertently be transcribed and this note may contain transcription errors which may not have been corrected upon publication of note.**

## 2022-10-15 NOTE — Telephone Encounter (Signed)
Called to check up on patient's current pregnancy status, in reference to her appt on 10/18/2022, after having a negative pregnancy test on 08/22/2022. Patient states " LMP 10/11/2022, and NO chances of pregnancy."  This concludes the discussion.   Ruel Favors, LPN

## 2022-10-17 ENCOUNTER — Encounter: Payer: Self-pay | Admitting: Rehabilitation

## 2022-10-17 ENCOUNTER — Ambulatory Visit: Payer: BC Managed Care – PPO | Attending: Surgery | Admitting: Rehabilitation

## 2022-10-17 DIAGNOSIS — Z9189 Other specified personal risk factors, not elsewhere classified: Secondary | ICD-10-CM | POA: Diagnosis present

## 2022-10-17 DIAGNOSIS — Z170 Estrogen receptor positive status [ER+]: Secondary | ICD-10-CM | POA: Insufficient documentation

## 2022-10-17 DIAGNOSIS — Z483 Aftercare following surgery for neoplasm: Secondary | ICD-10-CM | POA: Diagnosis present

## 2022-10-17 DIAGNOSIS — R293 Abnormal posture: Secondary | ICD-10-CM | POA: Diagnosis present

## 2022-10-17 DIAGNOSIS — C50211 Malignant neoplasm of upper-inner quadrant of right female breast: Secondary | ICD-10-CM | POA: Diagnosis present

## 2022-10-17 NOTE — Therapy (Signed)
OUTPATIENT PHYSICAL THERAPY BREAST CANCER TREATMENT   Patient Name: Jennifer Kane MRN: [id number] DOB:20-Apr-1976, 47 y.o., female Today's Date: 10/17/2022  END OF SESSION:  PT End of Session - 10/17/22 1459     Visit Number 3    Number of Visits 6    Date for PT Re-Evaluation 10/31/22    PT Start Time 1500    PT Stop Time 1542    PT Time Calculation (min) 42 min    Activity Tolerance Patient tolerated treatment well    Behavior During Therapy [facility] for tasks assessed/performed              Past Medical History:  Diagnosis Date   Anxiety    Asthma    Breast cancer (HCC) 08/07/2022   Diabetes (HCC)    Dysrhythmia    Hx of Irregular Heartbeat   Family history of brain cancer    Family history of breast cancer    Family history of ovarian cancer    GERD (gastroesophageal reflux disease)    History of panic attacks    History of tachycardia    Details not clear, reported abnormal tilt table test at age 83, on chronic low-dose beta-blocker   PONV (postoperative nausea and vomiting)    Past Surgical History:  Procedure Laterality Date   BREAST BIOPSY Right 08/07/2022   [place] RT BREAST BX W LOC DEV 1ST LESION IMG BX SPEC US GUIDE 08/07/2022 [facility]   BREAST BIOPSY  09/04/2022   [place] RT RADIOACTIVE SEED LOC 09/04/2022 [facility]   BREAST LUMPECTOMY WITH RADIOACTIVE SEED AND SENTINEL LYMPH NODE BIOPSY Right 09/06/2022   Procedure: RIGHT BREAST LUMPECTOMY WITH RADIOACTIVE SEED AND SENTINEL LYMPH NODE BIOPSY;  Surgeon: Abigail Miyamoto, MD;  Location: [facility];  Service: General;  Laterality: Right;   CHOLECYSTECTOMY N/A 05/01/2018   Procedure: LAPAROSCOPIC CHOLECYSTECTOMY;  Surgeon: Rodman Pickle, MD;  Location: [facility];  Service: General;  Laterality: N/A;   KNEE ARTHROSCOPY  1989   No previous surgeries     WISDOM TOOTH EXTRACTION     Patient Active Problem List   Diagnosis Date Noted   Genetic testing 09/24/2022   Family history of breast cancer  09/12/2022   Family history of ovarian cancer 09/12/2022   Family history of brain cancer 09/12/2022   Malignant neoplasm of upper-inner quadrant of right breast in female, estrogen receptor positive (HCC) 08/24/2022   Moderate persistent asthma, uncomplicated 01/31/2022   Seasonal and perennial allergic rhinitis 01/31/2022   Anaphylactic shock due to adverse food reaction 01/31/2022    REFERRING PROVIDER: Dr. Magnus Ivan  REFERRING DIAG: Rt breast cancer  THERAPY DIAG:  Malignant neoplasm of upper-inner quadrant of right breast in female, estrogen receptor positive (HCC)  Abnormal posture  Aftercare following surgery for neoplasm  At risk for lymphedema  Rationale for Evaluation and Treatment: Rehabilitation  ONSET DATE: 08/13/22  SUBJECTIVE:  SUBJECTIVE STATEMENT:  I am not feeling sore anymore.  I haven't felt much catching either.    EVAL: I am very sore in the armpit.  I am noticing a catch in the armpit as I lift out to the side.  Wearing the compression bra.    PERTINENT HISTORY:  Patient was diagnosed with right grade 2 IDC. It measures 1.1 cm. It is ER/PR positive with a Ki67 of 10%. Rt breast lumpectomy and SLNB on 09/06/22 with 7 negative nodes removed No benefit to chemotherapy and pt will have radiation and antiestrogen therapy.  Other hx includes asthma, DM. Height: 5'3"   PATIENT GOALS:  Reassess how my recovery is going related to arm function, pain, and swelling.  PAIN:  Are you having pain? No  PRECAUTIONS: Recent Surgery, right UE Lymphedema risk  ACTIVITY LEVEL / LEISURE: I am back to all activities     OBJECTIVE:   PATIENT SURVEYS:  QUICK DASH: 18% from 2%  OBSERVATIONS: Increased swelling in the Rt axilla and supraclavicular region as well as lateral trunk region  under axilla, incisions healed, mag trace visible.   POSTURE:  Rounded shoulders   LYMPHEDEMA ASSESSMENT:  UPPER EXTREMITY AROM/PROM:   A/PROM RIGHT   eval   10/03/22 10/17/22  Shoulder extension 45 45   Shoulder flexion 170 170 - pn 170  Shoulder abduction 165 170 - pn 170  Shoulder internal rotation      Shoulder external rotation 90 90                           (Blank rows = not tested)   A/PROM LEFT   eval  Shoulder extension 47  Shoulder flexion 160  Shoulder abduction 165  Shoulder internal rotation    Shoulder external rotation 95                          (Blank rows = not tested)   CERVICAL AROM: All within normal limits:    UPPER EXTREMITY STRENGTH: 5/5 - pain top of the shoulder with resisted flexion and abduction   LYMPHEDEMA ASSESSMENTS:    LANDMARK RIGHT   eval 10/03/22  10 cm proximal to olecranon process 38 39  Olecranon process 31 31  10  cm proximal to ulnar styloid process 27.5 27.5  Just proximal to ulnar styloid process 18.5 19  Across hand at thumb web space 20.5 21.5  At base of 2nd digit 7.5 7.5  (Blank rows = not tested)   LANDMARK LEFT   eval  10 cm proximal to olecranon process 35.5  Olecranon process 30  10 cm proximal to ulnar styloid process 26  Just proximal to ulnar styloid process 19  Across hand at thumb web space 21  At base of 2nd digit 7.2  (Blank rows = not tested)  TODAY"S TREATMENT: 10/17/22 STM Rt pectoralis, deltoid, supraspinatus and in sidelying to the posterior shoulder and latissimus wihtout significant tenderness Manual resisted ER/IR 5" x 5 each Standing row yellow x 10, extension x 10, bil ER x 10 with addition to HEP  10/03/22 EVAL, education on a brief decongestion sequence to start per HEP section Gave rectangle of foam for side and front of bra   PATIENT EDUCATION:  Education details: post op per below, abbreviated MLD, POC Person educated: Patient Education method: Programmer, multimedia, Demonstration, Verbal cues,  and Handouts Education comprehension: verbalized understanding and returned demonstration  HOME EXERCISE  PROGRAM: Reviewed previously given post op HEP Abbreviated MLD: 5-10 circles at collarbones, deep breathing, circles in both armpits, skin stretch from Rt armpit to left armpit Access Code: [email or web address] URL: [email or web address] Date: 10/17/2022 Prepared by: Gwenevere Abbot  Exercises - Standing Row with Anchored Resistance  - 1 x daily - 3-4 x weekly - 1-3 sets - 10 reps - 2-3 second hold - Shoulder extension with resistance - Neutral  - 1 x daily - 3-4 x weekly - 1-3 sets - 10 reps - 2-3 sec hold - Shoulder External Rotation and Scapular Retraction with Resistance  - 1 x daily - 3-4 x weekly - 1-3 sets - 10 reps - 2-3 sec hold - Standing Shoulder Flexion with Resistance  - 1 x daily - 3-4 x weekly - 1-3 sets - 10 reps - 2-3 sec hold  ASSESSMENT:  CLINICAL IMPRESSION: Pt is doing better than last check in.  She is no longer feeling swollen or sore and is having minimal pinching at the shoulder .  She is ind with a brief HEP for the shoulder and knows when to return.    Pt will benefit from skilled therapeutic intervention to improve on the following deficits: Decreased knowledge of precautions, impaired UE functional use, pain, decreased ROM, postural dysfunction.   PT treatment/interventions: ADL/Self care home management, Therapeutic exercises, Therapeutic activity, Patient/Family education, Self Care, Joint mobilization, Manual therapy, and Re-evaluation   GOALS: Goals reviewed with patient? Yes  LONG TERM GOALS:  (STG=LTG)  GOALS Name Target Date  Goal status  1 Pt will demonstrate she has regained full shoulder ROM and function post operatively compared to baselines.  Baseline: 10/03/22 MET  2 Pt will be ind MLD for the Rt axillary and supraclavicular region 10/31/22 MET  3 Pt will be ind with exercises to decrease impingement like symptoms at the shoulder  10/31/22  MET    PLAN:  PT FREQUENCY/DURATION: 1x per week x 4 weeks  PLAN FOR NEXT SESSION: what is status of swelling? Any better? Perform MLD for the Rt upper quadrant (breast?), teach pt full sequence or more steps as needed, joint mob- inf and post, impingement TE as tolerated: supine or seated scap avoiding pain? + row and pro/ret   [facility]  [address], [address]  [place] [address]  [phone]  After Breast Cancer Class It is recommended you attend the ABC class to be educated on lymphedema risk reduction. This class is free of charge and lasts for 1 hour. It is a 1-time class. You will need to download the TEAMS app either on your phone or computer. We will send you a link the night before or the morning of the class. You should be able to click on that link to join the class. This is not a confidential class. You don't have to turn your camera on, but other participants may be able to see your email address.  Scar massage You can begin gentle scar massage to you incision sites. Gently place one hand on the incision and move the skin (without sliding on the skin) in various directions. Do this for a few minutes and then you can gently massage either coconut oil or vitamin E cream into the scars.  Compression garment You should continue wearing your compression bra until you feel like you no longer have swelling.  Home exercise Program Continue doing the exercises you were given until you feel like you can do them without feeling any tightness  at the end.   Walking Program Studies show that 30 minutes of walking per day (fast enough to elevate your heart rate) can significantly reduce the risk of a cancer recurrence. If you can't walk due to other medical reasons, we encourage you to find another activity you could do (like a stationary bike or water exercise).  Posture After breast cancer surgery, people frequently sit with rounded shoulders posture  because it puts their incisions on slack and feels better. If you sit like this and scar tissue forms in that position, you can become very tight and have pain sitting or standing with good posture. Try to be aware of your posture and sit and stand up tall to heal properly.  Follow up PT: It is recommended you return every 3 months for the first 3 years following surgery to be assessed on the SOZO machine for an L-Dex score. This helps prevent clinically significant lymphedema in 95% of patients. These follow up screens are 10 minute appointments that you are not billed for.  Idamae Lusher, PT 10/17/2022, 3:43 PM  PHYSICAL THERAPY DISCHARGE SUMMARY  Visits from Start of Care: 3  Current functional level related to goals / functional outcomes: Ready for DC   Remaining deficits: Lymphedema risk, chronic shoulder impingment   Education / Equipment: Final HEP  Plan: Patient agrees to discharge.  Patient is being discharged due to meeting the stated rehab goals.

## 2022-10-18 ENCOUNTER — Encounter: Payer: Self-pay | Admitting: Radiation Oncology

## 2022-10-18 ENCOUNTER — Ambulatory Visit
Admission: RE | Admit: 2022-10-18 | Discharge: 2022-10-18 | Disposition: A | Discharge status: Home / Self Care | Payer: BC Managed Care – PPO | Source: Ambulatory Visit | Attending: Radiation Oncology | Admitting: Radiation Oncology

## 2022-10-18 VITALS — BP 102/79 | HR 63 | Temp 97.9°F | Resp 20 | Ht 63.0 in | Wt 234.6 lb

## 2022-10-18 DIAGNOSIS — Z803 Family history of malignant neoplasm of breast: Secondary | ICD-10-CM | POA: Insufficient documentation

## 2022-10-18 DIAGNOSIS — C50211 Malignant neoplasm of upper-inner quadrant of right female breast: Secondary | ICD-10-CM | POA: Insufficient documentation

## 2022-10-18 DIAGNOSIS — Z170 Estrogen receptor positive status [ER+]: Secondary | ICD-10-CM

## 2022-10-18 DIAGNOSIS — Z510 Encounter for antineoplastic radiation therapy: Secondary | ICD-10-CM | POA: Insufficient documentation

## 2022-10-18 DIAGNOSIS — K219 Gastro-esophageal reflux disease without esophagitis: Secondary | ICD-10-CM | POA: Diagnosis not present

## 2022-10-18 DIAGNOSIS — Z79899 Other long term (current) drug therapy: Secondary | ICD-10-CM | POA: Insufficient documentation

## 2022-10-18 DIAGNOSIS — J45909 Unspecified asthma, uncomplicated: Secondary | ICD-10-CM | POA: Diagnosis not present

## 2022-10-18 DIAGNOSIS — Z801 Family history of malignant neoplasm of trachea, bronchus and lung: Secondary | ICD-10-CM | POA: Insufficient documentation

## 2022-10-18 DIAGNOSIS — E119 Type 2 diabetes mellitus without complications: Secondary | ICD-10-CM | POA: Diagnosis not present

## 2022-10-18 DIAGNOSIS — Z8041 Family history of malignant neoplasm of ovary: Secondary | ICD-10-CM | POA: Insufficient documentation

## 2022-10-18 NOTE — Progress Notes (Signed)
Nursing interview for Malignant neoplasm of upper-inner quadrant of right breast in female, estrogen receptor positive (HCC).  Patient identity verified x2.  Patient reports doing well. No issues conveyed at this time.  Meaningful use complete.  Patient had a negative pregnancy test on 08/22/2022. Patient states " LMP 10/11/2022, and NO chances of pregnancy."   Vitals- BP 102/79 (BP Location: Left Arm, Patient Position: Sitting, Cuff Size: Large)   Pulse 63   Temp 97.9 F (36.6 C) (Temporal)   Resp 20   Ht 5\' 3"  (1.6 m)   Wt 234 lb 9.6 oz (106.4 kg)   LMP 10/11/2022   SpO2 99%   BMI 41.56 kg/m    This concludes the interview.   Ruel Favors, LPN

## 2022-10-23 DIAGNOSIS — Z510 Encounter for antineoplastic radiation therapy: Secondary | ICD-10-CM | POA: Diagnosis not present

## 2022-10-24 ENCOUNTER — Encounter: Payer: Self-pay | Admitting: *Deleted

## 2022-10-24 DIAGNOSIS — Z170 Estrogen receptor positive status [ER+]: Secondary | ICD-10-CM

## 2022-10-25 ENCOUNTER — Telehealth: Payer: Self-pay | Admitting: Hematology and Oncology

## 2022-10-25 NOTE — Telephone Encounter (Signed)
Patient aware of upcoming appointment time/dates after treatment.

## 2022-11-05 ENCOUNTER — Ambulatory Visit
Admission: RE | Admit: 2022-11-05 | Discharge: 2022-11-05 | Disposition: A | Discharge status: Home / Self Care | Payer: BC Managed Care – PPO | Source: Ambulatory Visit | Attending: Radiation Oncology | Admitting: Radiation Oncology

## 2022-11-05 ENCOUNTER — Other Ambulatory Visit: Payer: Self-pay

## 2022-11-05 DIAGNOSIS — Z510 Encounter for antineoplastic radiation therapy: Secondary | ICD-10-CM | POA: Diagnosis not present

## 2022-11-05 LAB — RAD ONC ARIA SESSION SUMMARY
Course Elapsed Days: 0
Plan Fractions Treated to Date: 1
Plan Prescribed Dose Per Fraction: 1.8 Gy
Plan Total Fractions Prescribed: 28
Plan Total Prescribed Dose: 50.4 Gy
Reference Point Dosage Given to Date: 1.8 Gy
Reference Point Session Dosage Given: 1.8 Gy
Session Number: 1

## 2022-11-06 ENCOUNTER — Other Ambulatory Visit: Payer: Self-pay

## 2022-11-06 ENCOUNTER — Ambulatory Visit
Admission: RE | Admit: 2022-11-06 | Discharge: 2022-11-06 | Disposition: A | Discharge status: Home / Self Care | Payer: BC Managed Care – PPO | Source: Ambulatory Visit | Attending: Radiation Oncology | Admitting: Radiation Oncology

## 2022-11-06 DIAGNOSIS — Z510 Encounter for antineoplastic radiation therapy: Secondary | ICD-10-CM | POA: Diagnosis not present

## 2022-11-06 LAB — RAD ONC ARIA SESSION SUMMARY
Course Elapsed Days: 1
Plan Fractions Treated to Date: 1
Plan Prescribed Dose Per Fraction: 2.66 Gy
Plan Total Fractions Prescribed: 15
Plan Total Prescribed Dose: 39.9 Gy
Reference Point Dosage Given to Date: 2.66 Gy
Reference Point Session Dosage Given: 2.66 Gy
Session Number: 2

## 2022-11-07 ENCOUNTER — Other Ambulatory Visit: Payer: Self-pay

## 2022-11-07 ENCOUNTER — Ambulatory Visit
Admission: RE | Admit: 2022-11-07 | Discharge: 2022-11-07 | Disposition: A | Discharge status: Home / Self Care | Payer: BC Managed Care – PPO | Source: Ambulatory Visit | Attending: Radiation Oncology | Admitting: Radiation Oncology

## 2022-11-07 DIAGNOSIS — Z510 Encounter for antineoplastic radiation therapy: Secondary | ICD-10-CM | POA: Diagnosis not present

## 2022-11-07 LAB — RAD ONC ARIA SESSION SUMMARY
Course Elapsed Days: 2
Plan Fractions Treated to Date: 2
Plan Prescribed Dose Per Fraction: 2.66 Gy
Plan Total Fractions Prescribed: 15
Plan Total Prescribed Dose: 39.9 Gy
Reference Point Dosage Given to Date: 5.32 Gy
Reference Point Session Dosage Given: 2.66 Gy
Session Number: 3

## 2022-11-08 ENCOUNTER — Ambulatory Visit
Admission: RE | Admit: 2022-11-08 | Discharge: 2022-11-08 | Disposition: A | Discharge status: Home / Self Care | Payer: BC Managed Care – PPO | Source: Ambulatory Visit | Attending: Radiation Oncology | Admitting: Radiation Oncology

## 2022-11-08 ENCOUNTER — Other Ambulatory Visit: Payer: Self-pay

## 2022-11-08 DIAGNOSIS — Z510 Encounter for antineoplastic radiation therapy: Secondary | ICD-10-CM | POA: Diagnosis not present

## 2022-11-08 LAB — RAD ONC ARIA SESSION SUMMARY
Course Elapsed Days: 3
Plan Fractions Treated to Date: 3
Plan Prescribed Dose Per Fraction: 2.66 Gy
Plan Total Fractions Prescribed: 15
Plan Total Prescribed Dose: 39.9 Gy
Reference Point Dosage Given to Date: 7.98 Gy
Reference Point Session Dosage Given: 2.66 Gy
Session Number: 4

## 2022-11-09 ENCOUNTER — Ambulatory Visit
Admission: RE | Admit: 2022-11-09 | Discharge: 2022-11-09 | Disposition: A | Discharge status: Home / Self Care | Payer: BC Managed Care – PPO | Source: Ambulatory Visit | Attending: Radiation Oncology | Admitting: Radiation Oncology

## 2022-11-09 ENCOUNTER — Other Ambulatory Visit: Payer: Self-pay

## 2022-11-09 DIAGNOSIS — Z170 Estrogen receptor positive status [ER+]: Secondary | ICD-10-CM

## 2022-11-09 DIAGNOSIS — Z510 Encounter for antineoplastic radiation therapy: Secondary | ICD-10-CM | POA: Diagnosis not present

## 2022-11-09 LAB — RAD ONC ARIA SESSION SUMMARY
Course Elapsed Days: 4
Plan Fractions Treated to Date: 4
Plan Prescribed Dose Per Fraction: 2.66 Gy
Plan Total Fractions Prescribed: 15
Plan Total Prescribed Dose: 39.9 Gy
Reference Point Dosage Given to Date: 10.64 Gy
Reference Point Session Dosage Given: 2.66 Gy
Session Number: 5

## 2022-11-09 MED ORDER — RADIAPLEXRX EX GEL: Freq: Once | CUTANEOUS | Status: AC

## 2022-11-12 ENCOUNTER — Other Ambulatory Visit: Payer: Self-pay

## 2022-11-12 ENCOUNTER — Ambulatory Visit
Admission: RE | Admit: 2022-11-12 | Discharge: 2022-11-12 | Disposition: A | Discharge status: Home / Self Care | Payer: BC Managed Care – PPO | Source: Ambulatory Visit | Attending: Radiation Oncology | Admitting: Radiation Oncology

## 2022-11-12 DIAGNOSIS — C50211 Malignant neoplasm of upper-inner quadrant of right female breast: Secondary | ICD-10-CM | POA: Insufficient documentation

## 2022-11-12 DIAGNOSIS — Z510 Encounter for antineoplastic radiation therapy: Secondary | ICD-10-CM | POA: Diagnosis present

## 2022-11-12 LAB — RAD ONC ARIA SESSION SUMMARY
Course Elapsed Days: 7
Plan Fractions Treated to Date: 5
Plan Prescribed Dose Per Fraction: 2.66 Gy
Plan Total Fractions Prescribed: 15
Plan Total Prescribed Dose: 39.9 Gy
Reference Point Dosage Given to Date: 13.3 Gy
Reference Point Session Dosage Given: 2.66 Gy
Session Number: 6

## 2022-11-13 ENCOUNTER — Other Ambulatory Visit: Payer: Self-pay

## 2022-11-13 ENCOUNTER — Ambulatory Visit
Admission: RE | Admit: 2022-11-13 | Discharge: 2022-11-13 | Disposition: A | Discharge status: Home / Self Care | Payer: BC Managed Care – PPO | Source: Ambulatory Visit | Attending: Radiation Oncology | Admitting: Radiation Oncology

## 2022-11-13 DIAGNOSIS — Z510 Encounter for antineoplastic radiation therapy: Secondary | ICD-10-CM | POA: Diagnosis not present

## 2022-11-13 LAB — RAD ONC ARIA SESSION SUMMARY
Course Elapsed Days: 8
Plan Fractions Treated to Date: 6
Plan Prescribed Dose Per Fraction: 2.66 Gy
Plan Total Fractions Prescribed: 15
Plan Total Prescribed Dose: 39.9 Gy
Reference Point Dosage Given to Date: 15.96 Gy
Reference Point Session Dosage Given: 2.66 Gy
Session Number: 7

## 2022-11-14 ENCOUNTER — Ambulatory Visit: Payer: BC Managed Care – PPO

## 2022-11-14 ENCOUNTER — Other Ambulatory Visit: Payer: Self-pay

## 2022-11-14 ENCOUNTER — Ambulatory Visit
Admission: RE | Admit: 2022-11-14 | Discharge: 2022-11-14 | Disposition: A | Discharge status: Home / Self Care | Payer: BC Managed Care – PPO | Source: Ambulatory Visit | Attending: Radiation Oncology | Admitting: Radiation Oncology

## 2022-11-14 DIAGNOSIS — Z510 Encounter for antineoplastic radiation therapy: Secondary | ICD-10-CM | POA: Diagnosis not present

## 2022-11-14 LAB — RAD ONC ARIA SESSION SUMMARY
Course Elapsed Days: 9
Plan Fractions Treated to Date: 7
Plan Prescribed Dose Per Fraction: 2.66 Gy
Plan Total Fractions Prescribed: 15
Plan Total Prescribed Dose: 39.9 Gy
Reference Point Dosage Given to Date: 18.62 Gy
Reference Point Session Dosage Given: 2.66 Gy
Session Number: 8

## 2022-11-16 ENCOUNTER — Ambulatory Visit: Payer: BC Managed Care – PPO

## 2022-11-16 ENCOUNTER — Other Ambulatory Visit: Payer: Self-pay

## 2022-11-16 ENCOUNTER — Ambulatory Visit
Admission: RE | Admit: 2022-11-16 | Discharge: 2022-11-16 | Disposition: A | Discharge status: Home / Self Care | Payer: BC Managed Care – PPO | Source: Ambulatory Visit | Attending: Radiation Oncology | Admitting: Radiation Oncology

## 2022-11-16 DIAGNOSIS — Z510 Encounter for antineoplastic radiation therapy: Secondary | ICD-10-CM | POA: Diagnosis not present

## 2022-11-16 LAB — RAD ONC ARIA SESSION SUMMARY
Course Elapsed Days: 11
Plan Fractions Treated to Date: 8
Plan Prescribed Dose Per Fraction: 2.66 Gy
Plan Total Fractions Prescribed: 15
Plan Total Prescribed Dose: 39.9 Gy
Reference Point Dosage Given to Date: 21.28 Gy
Reference Point Session Dosage Given: 2.66 Gy
Session Number: 9

## 2022-11-19 ENCOUNTER — Ambulatory Visit
Admission: RE | Admit: 2022-11-19 | Discharge: 2022-11-19 | Disposition: A | Discharge status: Home / Self Care | Payer: BC Managed Care – PPO | Source: Ambulatory Visit | Attending: Radiation Oncology | Admitting: Radiation Oncology

## 2022-11-19 ENCOUNTER — Other Ambulatory Visit: Payer: Self-pay

## 2022-11-19 ENCOUNTER — Encounter: Payer: Self-pay | Admitting: *Deleted

## 2022-11-19 ENCOUNTER — Ambulatory Visit: Payer: BC Managed Care – PPO

## 2022-11-19 DIAGNOSIS — Z510 Encounter for antineoplastic radiation therapy: Secondary | ICD-10-CM | POA: Diagnosis not present

## 2022-11-19 LAB — RAD ONC ARIA SESSION SUMMARY
Course Elapsed Days: 14
Plan Fractions Treated to Date: 9
Plan Prescribed Dose Per Fraction: 2.66 Gy
Plan Total Fractions Prescribed: 15
Plan Total Prescribed Dose: 39.9 Gy
Reference Point Dosage Given to Date: 23.94 Gy
Reference Point Session Dosage Given: 2.66 Gy
Session Number: 10

## 2022-11-20 ENCOUNTER — Ambulatory Visit
Admission: RE | Admit: 2022-11-20 | Discharge: 2022-11-20 | Disposition: A | Discharge status: Home / Self Care | Payer: BC Managed Care – PPO | Source: Ambulatory Visit | Attending: Radiation Oncology | Admitting: Radiation Oncology

## 2022-11-20 ENCOUNTER — Other Ambulatory Visit: Payer: Self-pay

## 2022-11-20 DIAGNOSIS — Z510 Encounter for antineoplastic radiation therapy: Secondary | ICD-10-CM | POA: Diagnosis not present

## 2022-11-20 LAB — RAD ONC ARIA SESSION SUMMARY
Course Elapsed Days: 15
Plan Fractions Treated to Date: 10
Plan Prescribed Dose Per Fraction: 2.66 Gy
Plan Total Fractions Prescribed: 15
Plan Total Prescribed Dose: 39.9 Gy
Reference Point Dosage Given to Date: 26.6 Gy
Reference Point Session Dosage Given: 2.66 Gy
Session Number: 11

## 2022-11-21 ENCOUNTER — Ambulatory Visit
Admission: RE | Admit: 2022-11-21 | Discharge: 2022-11-21 | Disposition: A | Discharge status: Home / Self Care | Payer: BC Managed Care – PPO | Source: Ambulatory Visit | Attending: Radiation Oncology | Admitting: Radiation Oncology

## 2022-11-21 ENCOUNTER — Other Ambulatory Visit: Payer: Self-pay

## 2022-11-21 ENCOUNTER — Telehealth: Payer: Self-pay | Admitting: Hematology and Oncology

## 2022-11-21 DIAGNOSIS — Z510 Encounter for antineoplastic radiation therapy: Secondary | ICD-10-CM | POA: Diagnosis not present

## 2022-11-21 LAB — RAD ONC ARIA SESSION SUMMARY
Course Elapsed Days: 16
Plan Fractions Treated to Date: 11
Plan Prescribed Dose Per Fraction: 2.66 Gy
Plan Total Fractions Prescribed: 15
Plan Total Prescribed Dose: 39.9 Gy
Reference Point Dosage Given to Date: 29.26 Gy
Reference Point Session Dosage Given: 2.66 Gy
Session Number: 12

## 2022-11-21 NOTE — Telephone Encounter (Signed)
Scheduled appointment per scheduling message. Patient is aware of the made appointment. 

## 2022-11-22 ENCOUNTER — Other Ambulatory Visit: Payer: Self-pay

## 2022-11-22 ENCOUNTER — Ambulatory Visit
Admission: RE | Admit: 2022-11-22 | Discharge: 2022-11-22 | Disposition: A | Discharge status: Home / Self Care | Payer: BC Managed Care – PPO | Source: Ambulatory Visit | Attending: Radiation Oncology | Admitting: Radiation Oncology

## 2022-11-22 DIAGNOSIS — Z510 Encounter for antineoplastic radiation therapy: Secondary | ICD-10-CM | POA: Diagnosis not present

## 2022-11-22 LAB — RAD ONC ARIA SESSION SUMMARY
Course Elapsed Days: 17
Plan Fractions Treated to Date: 12
Plan Prescribed Dose Per Fraction: 2.66 Gy
Plan Total Fractions Prescribed: 15
Plan Total Prescribed Dose: 39.9 Gy
Reference Point Dosage Given to Date: 31.92 Gy
Reference Point Session Dosage Given: 2.66 Gy
Session Number: 13

## 2022-11-23 ENCOUNTER — Other Ambulatory Visit: Payer: Self-pay

## 2022-11-23 ENCOUNTER — Ambulatory Visit: Admission: RE | Admit: 2022-11-23 | Payer: BC Managed Care – PPO | Source: Ambulatory Visit

## 2022-11-23 ENCOUNTER — Ambulatory Visit
Admission: RE | Admit: 2022-11-23 | Discharge: 2022-11-23 | Disposition: A | Discharge status: Home / Self Care | Payer: BC Managed Care – PPO | Source: Ambulatory Visit | Attending: Radiation Oncology | Admitting: Radiation Oncology

## 2022-11-23 DIAGNOSIS — Z510 Encounter for antineoplastic radiation therapy: Secondary | ICD-10-CM | POA: Diagnosis not present

## 2022-11-23 LAB — RAD ONC ARIA SESSION SUMMARY
Course Elapsed Days: 18
Plan Fractions Treated to Date: 13
Plan Prescribed Dose Per Fraction: 2.66 Gy
Plan Total Fractions Prescribed: 15
Plan Total Prescribed Dose: 39.9 Gy
Reference Point Dosage Given to Date: 34.58 Gy
Reference Point Session Dosage Given: 2.66 Gy
Session Number: 14

## 2022-11-24 DIAGNOSIS — Z510 Encounter for antineoplastic radiation therapy: Secondary | ICD-10-CM | POA: Diagnosis not present

## 2022-11-26 ENCOUNTER — Other Ambulatory Visit: Payer: Self-pay

## 2022-11-26 ENCOUNTER — Ambulatory Visit: Admission: RE | Admit: 2022-11-26 | Payer: BC Managed Care – PPO | Source: Ambulatory Visit

## 2022-11-26 DIAGNOSIS — Z510 Encounter for antineoplastic radiation therapy: Secondary | ICD-10-CM | POA: Diagnosis not present

## 2022-11-26 LAB — RAD ONC ARIA SESSION SUMMARY
Course Elapsed Days: 21
Plan Fractions Treated to Date: 14
Plan Prescribed Dose Per Fraction: 2.66 Gy
Plan Total Fractions Prescribed: 15
Plan Total Prescribed Dose: 39.9 Gy
Reference Point Dosage Given to Date: 37.24 Gy
Reference Point Session Dosage Given: 2.66 Gy
Session Number: 15

## 2022-11-27 ENCOUNTER — Other Ambulatory Visit: Payer: Self-pay

## 2022-11-27 ENCOUNTER — Ambulatory Visit
Admission: RE | Admit: 2022-11-27 | Discharge: 2022-11-27 | Disposition: A | Discharge status: Home / Self Care | Payer: BC Managed Care – PPO | Source: Ambulatory Visit | Attending: Radiation Oncology | Admitting: Radiation Oncology

## 2022-11-27 DIAGNOSIS — Z510 Encounter for antineoplastic radiation therapy: Secondary | ICD-10-CM | POA: Diagnosis not present

## 2022-11-27 LAB — RAD ONC ARIA SESSION SUMMARY
Course Elapsed Days: 22
Plan Fractions Treated to Date: 15
Plan Prescribed Dose Per Fraction: 2.66 Gy
Plan Total Fractions Prescribed: 15
Plan Total Prescribed Dose: 39.9 Gy
Reference Point Dosage Given to Date: 39.9 Gy
Reference Point Session Dosage Given: 2.66 Gy
Session Number: 16

## 2022-11-28 ENCOUNTER — Other Ambulatory Visit: Payer: Self-pay

## 2022-11-28 ENCOUNTER — Ambulatory Visit
Admission: RE | Admit: 2022-11-28 | Discharge: 2022-11-28 | Disposition: A | Discharge status: Home / Self Care | Payer: BC Managed Care – PPO | Source: Ambulatory Visit | Attending: Radiation Oncology | Admitting: Radiation Oncology

## 2022-11-28 DIAGNOSIS — Z510 Encounter for antineoplastic radiation therapy: Secondary | ICD-10-CM | POA: Diagnosis not present

## 2022-11-28 LAB — RAD ONC ARIA SESSION SUMMARY
Course Elapsed Days: 23
Plan Fractions Treated to Date: 1
Plan Prescribed Dose Per Fraction: 2.5 Gy
Plan Total Fractions Prescribed: 4
Plan Total Prescribed Dose: 10 Gy
Reference Point Dosage Given to Date: 2.5 Gy
Reference Point Session Dosage Given: 2.5 Gy
Session Number: 17

## 2022-11-29 ENCOUNTER — Other Ambulatory Visit: Payer: Self-pay

## 2022-11-29 ENCOUNTER — Ambulatory Visit
Admission: RE | Admit: 2022-11-29 | Discharge: 2022-11-29 | Disposition: A | Discharge status: Home / Self Care | Payer: BC Managed Care – PPO | Source: Ambulatory Visit | Attending: Radiation Oncology | Admitting: Radiation Oncology

## 2022-11-29 DIAGNOSIS — Z510 Encounter for antineoplastic radiation therapy: Secondary | ICD-10-CM | POA: Diagnosis not present

## 2022-11-29 LAB — RAD ONC ARIA SESSION SUMMARY
Course Elapsed Days: 24
Plan Fractions Treated to Date: 2
Plan Prescribed Dose Per Fraction: 2.5 Gy
Plan Total Fractions Prescribed: 4
Plan Total Prescribed Dose: 10 Gy
Reference Point Dosage Given to Date: 5 Gy
Reference Point Session Dosage Given: 2.5 Gy
Session Number: 18

## 2022-11-30 ENCOUNTER — Other Ambulatory Visit: Payer: Self-pay

## 2022-11-30 ENCOUNTER — Ambulatory Visit: Payer: BC Managed Care – PPO

## 2022-11-30 ENCOUNTER — Ambulatory Visit
Admission: RE | Admit: 2022-11-30 | Discharge: 2022-11-30 | Disposition: A | Discharge status: Home / Self Care | Payer: BC Managed Care – PPO | Source: Ambulatory Visit | Attending: Radiation Oncology | Admitting: Radiation Oncology

## 2022-12-03 ENCOUNTER — Ambulatory Visit: Payer: BC Managed Care – PPO | Attending: Surgery | Admitting: Rehabilitation

## 2022-12-03 ENCOUNTER — Other Ambulatory Visit: Payer: Self-pay

## 2022-12-03 ENCOUNTER — Ambulatory Visit
Admission: RE | Admit: 2022-12-03 | Discharge: 2022-12-03 | Disposition: A | Discharge status: Home / Self Care | Payer: BC Managed Care – PPO | Source: Ambulatory Visit | Attending: Radiation Oncology | Admitting: Radiation Oncology

## 2022-12-03 ENCOUNTER — Ambulatory Visit: Payer: BC Managed Care – PPO

## 2022-12-03 DIAGNOSIS — Z9189 Other specified personal risk factors, not elsewhere classified: Secondary | ICD-10-CM | POA: Insufficient documentation

## 2022-12-03 DIAGNOSIS — Z483 Aftercare following surgery for neoplasm: Secondary | ICD-10-CM | POA: Insufficient documentation

## 2022-12-03 DIAGNOSIS — Z510 Encounter for antineoplastic radiation therapy: Secondary | ICD-10-CM | POA: Diagnosis not present

## 2022-12-03 DIAGNOSIS — Z170 Estrogen receptor positive status [ER+]: Secondary | ICD-10-CM | POA: Insufficient documentation

## 2022-12-03 DIAGNOSIS — R293 Abnormal posture: Secondary | ICD-10-CM | POA: Insufficient documentation

## 2022-12-03 DIAGNOSIS — C50211 Malignant neoplasm of upper-inner quadrant of right female breast: Secondary | ICD-10-CM | POA: Insufficient documentation

## 2022-12-03 LAB — RAD ONC ARIA SESSION SUMMARY
Course Elapsed Days: 28
Plan Fractions Treated to Date: 3
Plan Prescribed Dose Per Fraction: 2.5 Gy
Plan Total Fractions Prescribed: 4
Plan Total Prescribed Dose: 10 Gy
Reference Point Dosage Given to Date: 7.5 Gy
Reference Point Session Dosage Given: 2.5 Gy
Session Number: 19

## 2022-12-04 ENCOUNTER — Other Ambulatory Visit: Payer: Self-pay

## 2022-12-04 ENCOUNTER — Ambulatory Visit
Admission: RE | Admit: 2022-12-04 | Discharge: 2022-12-04 | Disposition: A | Discharge status: Home / Self Care | Payer: BC Managed Care – PPO | Source: Ambulatory Visit | Attending: Radiation Oncology | Admitting: Radiation Oncology

## 2022-12-04 ENCOUNTER — Ambulatory Visit: Admission: RE | Admit: 2022-12-04 | Payer: BC Managed Care – PPO | Source: Ambulatory Visit

## 2022-12-04 ENCOUNTER — Ambulatory Visit: Payer: BC Managed Care – PPO

## 2022-12-04 DIAGNOSIS — Z510 Encounter for antineoplastic radiation therapy: Secondary | ICD-10-CM | POA: Diagnosis not present

## 2022-12-04 LAB — RAD ONC ARIA SESSION SUMMARY
Course Elapsed Days: 29
Plan Fractions Treated to Date: 4
Plan Prescribed Dose Per Fraction: 2.5 Gy
Plan Total Fractions Prescribed: 4
Plan Total Prescribed Dose: 10 Gy
Reference Point Dosage Given to Date: 10 Gy
Reference Point Session Dosage Given: 2.5 Gy
Session Number: 20

## 2022-12-05 ENCOUNTER — Ambulatory Visit: Payer: BC Managed Care – PPO

## 2022-12-05 NOTE — Radiation Completion Notes (Addendum)
  Radiation Oncology         [phone] ________________________________  Name: Jennifer Kane MRN: [id number]  Date of Service: 12/04/2022  DOB: May 01, 1976  End of Treatment Note  Diagnosis: Stage IA, pT1cN0M0, grade 2 ER/PR positive invasive ductal carcinoma of the right breast.   Intent: Curative     ==========DELIVERED PLANS==========  First Treatment Date: 2022-11-05 - Last Treatment Date: 2022-12-04   Plan Name: Breast_R Site: Breast, Right Technique: 3D Mode: Photon Dose Per Fraction: 1.8 Gy Prescribed Dose (Delivered / Prescribed): 1.8 Gy / 50.4 Gy Prescribed Fxs (Delivered / Prescribed): 1 / 28   Plan Name: Breast_R_Bst1 Site: Breast, Right Technique: 3D Mode: Photon Dose Per Fraction: 2.66 Gy Prescribed Dose (Delivered / Prescribed): 39.9 Gy / 39.9 Gy Prescribed Fxs (Delivered / Prescribed): 15 / 15   Plan Name: Breast_R_Bst2 Site: Breast, Right Technique: 3D Mode: Photon Dose Per Fraction: 2.5 Gy Prescribed Dose (Delivered / Prescribed): 10 Gy / 10 Gy Prescribed Fxs (Delivered / Prescribed): 4 / 4     ==========ON TREATMENT VISIT DATES========== 2022-11-09, 2022-11-16, 2022-11-23, 2022-12-04  See weekly On Treatment Notes in Epic for details. The patient tolerated radiation. She developed fatigue and anticipated skin changes in the treatment field.   The patient will receive a call in about one month from the radiation oncology department. She will continue follow up with Dr. Pamelia Hoit as well.      Osker Mason, PAC

## 2022-12-06 ENCOUNTER — Ambulatory Visit: Payer: BC Managed Care – PPO

## 2022-12-07 ENCOUNTER — Ambulatory Visit: Payer: BC Managed Care – PPO

## 2022-12-10 ENCOUNTER — Ambulatory Visit: Payer: BC Managed Care – PPO

## 2022-12-11 ENCOUNTER — Ambulatory Visit: Payer: BC Managed Care – PPO

## 2022-12-12 ENCOUNTER — Ambulatory Visit: Payer: BC Managed Care – PPO

## 2022-12-13 ENCOUNTER — Ambulatory Visit: Payer: BC Managed Care – PPO

## 2022-12-14 ENCOUNTER — Encounter: Payer: Self-pay | Admitting: *Deleted

## 2022-12-14 ENCOUNTER — Ambulatory Visit: Payer: BC Managed Care – PPO

## 2022-12-17 ENCOUNTER — Ambulatory Visit: Payer: BC Managed Care – PPO

## 2022-12-18 ENCOUNTER — Ambulatory Visit: Payer: BC Managed Care – PPO

## 2022-12-18 NOTE — Progress Notes (Signed)
Patient Care Team: Sheela Stack as PCP - General (Physician Assistant) Selinda Flavin, MD (Family Medicine) Donnelly Angelica, RN as Oncology Nurse Navigator Pershing Proud, RN as Oncology Nurse Navigator Serena Croissant, MD as Consulting Physician (Hematology and Oncology) Abigail Miyamoto, MD as Consulting Physician (General Surgery) Dorothy Puffer, MD as Consulting Physician (Radiation Oncology)  DIAGNOSIS: No diagnosis found.  SUMMARY OF ONCOLOGIC HISTORY: Oncology History  Malignant neoplasm of upper-inner quadrant of right breast in female, estrogen receptor positive (HCC)  08/07/2022 Initial Diagnosis   Mammogram detected suspicious mass in the right breast 12:30 position 1.1 cm (no evidence of malignancy in the area of concern in the LIQ) biopsy: Grade 2 IDC ER 95%, PR 100%, Ki67 10%, HER2 2+ by IHC negative by FISH ratio 1.09, copy #1.8   09/04/2022 Cancer Staging   Staging form: Breast, AJCC 8th Edition - Clinical: Stage IA (cT1c, cN0, cM0, G2, ER+, PR+, HER2-) - Signed by Serena Croissant, MD on 09/04/2022 Stage prefix: Initial diagnosis Histologic grading system: 3 grade system   09/06/2022 Surgery   Right lumpectomy: Grade 2 IDC 1.5 cm margins -0/7 lymph nodes negative, ER 95%, PR 100%, HER2 negative, Ki-67 10%   09/18/2022 Oncotype testing   Oncotype DX recurrence score 20 (risk of distant) and 9 years: 6%) no benefit of chemo   09/23/2022 Genetic Testing   Negative genetic testing on the Multi-cancer+RNAinsight panel.  The report date is Sep 23, 2022.  The Multi-Cancer + RNA Panel offered by [facility] includes sequencing and/or deletion/duplication analysis of the following 70 genes:  AIP*, ALK, APC*, ATM*, AXIN2*, BAP1*, BARD1*, BLM*, BMPR1A*, BRCA1*, BRCA2*, BRIP1*, CDC73*, CDH1*, CDK4, CDKN1B*, CDKN2A, CHEK2*, CTNNA1*, DICER1*, EPCAM (del/dup only), EGFR, FH*, FLCN*, GREM1 (promoter dup only), HOXB13, KIT, LZTR1, MAX*, MBD4, MEN1*, MET, MITF, MLH1*, MSH2*, MSH3*,  MSH6*, MUTYH*, NF1*, NF2*, NTHL1*, PALB2*, PDGFRA, PMS2*, POLD1*, POLE*, POT1*, PRKAR1A*, PTCH1*, PTEN*, RAD51C*, RAD51D*, RB1*, RET, SDHA* (sequencing only), SDHAF2*, SDHB*, SDHC*, SDHD*, SMAD4*, SMARCA4*, SMARCB1*, SMARCE1*, STK11*, SUFU*, TMEM127*, TP53*, TSC1*, TSC2*, VHL*. RNA analysis is performed for * genes.     CHIEF COMPLIANT: Follow-up after radiation  INTERVAL HISTORY: Jennifer Kane is a 47 y.o. female is here because of recent diagnosis of right breast cancer. She presents to the clinic for a follow-up.    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  MEDICATIONS:  Current Outpatient Medications  Medication Sig Dispense Refill   cetirizine (ZYRTEC) 5 MG chewable tablet Chew 5 mg by mouth daily.     EPINEPHrine (EPIPEN 2-PAK) 0.3 mg/0.3 mL IJ SOAJ injection Inject 0.3 mg into the muscle once.     escitalopram (LEXAPRO) 10 MG tablet Take 5 mg by mouth at bedtime.     ibuprofen (ADVIL) 200 MG tablet Take 200 mg by mouth every 6 (six) hours as needed for moderate pain.     Semaglutide,0.25 or 0.5MG /DOS, (OZEMPIC, 0.25 OR 0.5 MG/DOSE,) 2 MG/1.5ML SOPN Inject 0.5 mg into the skin once a week.     traMADol (ULTRAM) 50 MG tablet Take 1 tablet (50 mg total) by mouth every 6 (six) hours as needed for moderate pain or severe pain. (Patient not taking: Reported on 10/18/2022) 25 tablet 0   No current facility-administered medications for this visit.    PHYSICAL EXAMINATION: ECOG PERFORMANCE STATUS: {CHL ONC ECOG PS:[phone]}  There were no vitals filed for this visit. There were no vitals filed for this visit.  BREAST:*** No palpable masses or nodules in either right or left  breasts. No palpable axillary supraclavicular or infraclavicular adenopathy no breast tenderness or nipple discharge. (exam performed in the presence of a chaperone)  LABORATORY DATA:  I have reviewed the data as listed    Latest Ref Rng & Units 09/12/2022   10:26 AM 08/30/2022    2:57 PM 04/28/2018    9:40  AM  CMP  Glucose 70 - 99 mg/dL 161  096  99   BUN 6 - 20 mg/dL 11  11  10    Creatinine 0.44 - 1.00 mg/dL 0.45  4.09  8.11   Sodium 135 - 145 mmol/L 137  136  139   Potassium 3.5 - 5.1 mmol/L 4.1  3.6  4.1   Chloride 98 - 111 mmol/L 105  103  107   CO2 22 - 32 mmol/L 29  26  25    Calcium 8.9 - 10.3 mg/dL 8.8  8.7  8.9   Total Protein 6.5 - 8.1 g/dL 7.1     Total Bilirubin 0.3 - 1.2 mg/dL 0.4     Alkaline Phos 38 - 126 U/L 69     AST 15 - 41 U/L 25     ALT 0 - 44 U/L 39       Lab Results  Component Value Date   WBC 8.1 09/12/2022   HGB 13.8 09/12/2022   HCT 40.6 09/12/2022   MCV 94.6 09/12/2022   PLT 298 09/12/2022   NEUTROABS 5.0 09/12/2022    ASSESSMENT & PLAN:  No problem-specific Assessment & Plan notes found for this encounter.    No orders of the defined types were placed in this encounter.  The patient has a good understanding of the overall plan. she agrees with it. she will call with any problems that may develop before the next visit here. Total time spent: 30 mins including face to face time and time spent for planning, charting and co-ordination of care   Sherlyn Lick, CMA 12/18/22    I Janan Ridge am acting as a Neurosurgeon for [employer]  ***

## 2022-12-19 ENCOUNTER — Ambulatory Visit: Payer: BC Managed Care – PPO

## 2022-12-20 ENCOUNTER — Inpatient Hospital Stay: Payer: BC Managed Care – PPO | Attending: Genetic Counselor | Admitting: Hematology and Oncology

## 2022-12-20 ENCOUNTER — Other Ambulatory Visit: Payer: Self-pay

## 2022-12-20 ENCOUNTER — Ambulatory Visit: Payer: BC Managed Care – PPO

## 2022-12-20 VITALS — BP 105/70 | HR 92 | Temp 97.9°F | Resp 18 | Ht 63.0 in | Wt 229.2 lb

## 2022-12-20 DIAGNOSIS — Z170 Estrogen receptor positive status [ER+]: Secondary | ICD-10-CM | POA: Diagnosis not present

## 2022-12-20 DIAGNOSIS — C50211 Malignant neoplasm of upper-inner quadrant of right female breast: Secondary | ICD-10-CM | POA: Diagnosis present

## 2022-12-20 DIAGNOSIS — Z923 Personal history of irradiation: Secondary | ICD-10-CM | POA: Insufficient documentation

## 2022-12-20 DIAGNOSIS — Z79810 Long term (current) use of selective estrogen receptor modulators (SERMs): Secondary | ICD-10-CM | POA: Insufficient documentation

## 2022-12-20 MED ORDER — TAMOXIFEN CITRATE 20 MG PO TABS: 20.0000 mg | ORAL_TABLET | Freq: Every day | ORAL | 3 refills | Status: DC

## 2022-12-20 NOTE — Assessment & Plan Note (Addendum)
09/06/2022:Right lumpectomy: Grade 2 IDC 1.5 cm margins -0/7 lymph nodes negative, ER 95%, PR 100%, HER2 negative, Ki-67 10%  Oncotype Dx: Recurrence score 20 (6% risk of distant recurrence) 11/06/2022-12/04/2022: Adjuvant radiation  Treatment plan: Adjuvant antiestrogen therapy with tamoxifen 20 mg daily x 10 years Tamoxifen counseling: We discussed the risks and benefits of tamoxifen. These include but not limited to insomnia, hot flashes, mood changes, vaginal dryness, and weight gain. Although rare, serious side effects including endometrial cancer, risk of blood clots were also discussed. We strongly believe that the benefits far outweigh the risks. Patient understands these risks and consented to starting treatment.    Once she is in menopause we will consider switching her to anastrozole. She will start at half a tablet a day for 2 weeks and then increase to full tablet.  Return to clinic in 3 months for survivorship care plan visit

## 2022-12-26 NOTE — Therapy (Signed)
OUTPATIENT PHYSICAL THERAPY SOZO SCREENING NOTE   Patient Name: Jennifer Kane MRN: [id number] DOB:02/18/1976, 47 y.o., female Today's Date: 12/26/2022  PCP: Sheela Stack REFERRING PROVIDER: Abigail Miyamoto, MD    Past Medical History:  Diagnosis Date   Anxiety    Asthma    Breast cancer (HCC) 08/07/2022   Diabetes (HCC)    Dysrhythmia    Hx of Irregular Heartbeat   Family history of brain cancer    Family history of breast cancer    Family history of ovarian cancer    GERD (gastroesophageal reflux disease)    History of panic attacks    History of tachycardia    Details not clear, reported abnormal tilt table test at age 56, on chronic low-dose beta-blocker   PONV (postoperative nausea and vomiting)    Past Surgical History:  Procedure Laterality Date   BREAST BIOPSY Right 08/07/2022   [place] RT BREAST BX W LOC DEV 1ST LESION IMG BX SPEC US GUIDE 08/07/2022 [facility]   BREAST BIOPSY  09/04/2022   [place] RT RADIOACTIVE SEED LOC 09/04/2022 [facility]   BREAST LUMPECTOMY WITH RADIOACTIVE SEED AND SENTINEL LYMPH NODE BIOPSY Right 09/06/2022   Procedure: RIGHT BREAST LUMPECTOMY WITH RADIOACTIVE SEED AND SENTINEL LYMPH NODE BIOPSY;  Surgeon: Abigail Miyamoto, MD;  Location: [facility];  Service: General;  Laterality: Right;   CHOLECYSTECTOMY N/A 05/01/2018   Procedure: LAPAROSCOPIC CHOLECYSTECTOMY;  Surgeon: Rodman Pickle, MD;  Location: [facility];  Service: General;  Laterality: N/A;   KNEE ARTHROSCOPY  1989   No previous surgeries     WISDOM TOOTH EXTRACTION     Patient Active Problem List   Diagnosis Date Noted   Genetic testing 09/24/2022   Family history of breast cancer 09/12/2022   Family history of ovarian cancer 09/12/2022   Family history of brain cancer 09/12/2022   Malignant neoplasm of upper-inner quadrant of right breast in female, estrogen receptor positive (HCC) 08/24/2022   Moderate persistent asthma, uncomplicated 01/31/2022    Seasonal and perennial allergic rhinitis 01/31/2022   Anaphylactic shock due to adverse food reaction 01/31/2022    REFERRING DIAG: right breast cancer at risk for lymphedema  THERAPY DIAG:  No diagnosis found.  PERTINENT HISTORY:  Patient was diagnosed with right grade 2 IDC. It measures 1.1 cm. It is ER/PR positive with a Ki67 of 10%. Rt breast lumpectomy and SLNB on 09/06/22 with 7 negative nodes removed No benefit to chemotherapy and pt will have radiation and antiestrogen therapy. Other hx includes asthma, DM. Height: 5'3"   PRECAUTIONS: right UE Lymphedema risk, {Therapy precautions:24002}  SUBJECTIVE: ***  PAIN:  Are you having pain? {OPRCPAIN:27236}  SOZO SCREENING: Patient was assessed today using the SOZO machine to determine the lymphedema index score. This was compared to her baseline score. It was determined that she is within the recommended range when compared to her baseline and no further action is needed at this time. She will continue SOZO screenings. These are done every 3 months for 2 years post operatively followed by every 6 months for 2 years, and then annually.  Patient was assessed today using the SOZO machine to determine the lymphedema index score. This was compared to her baseline score. It was determined that she is NOT within the recommended range when compared to her baseline and so she was fitted for a compression garment while in the clinic today. It is recommended she return in 1 month to be reassessed. If she continues to measure  outside the recommended range, physical therapy treatment will be recommended at that time and a referral requested.    Waynette Buttery, PT 12/26/2022, 7:14 AM

## 2022-12-27 ENCOUNTER — Ambulatory Visit: Payer: BC Managed Care – PPO | Attending: Surgery

## 2022-12-27 DIAGNOSIS — C50211 Malignant neoplasm of upper-inner quadrant of right female breast: Secondary | ICD-10-CM | POA: Insufficient documentation

## 2022-12-27 DIAGNOSIS — R293 Abnormal posture: Secondary | ICD-10-CM | POA: Insufficient documentation

## 2022-12-27 DIAGNOSIS — Z483 Aftercare following surgery for neoplasm: Secondary | ICD-10-CM | POA: Insufficient documentation

## 2022-12-27 DIAGNOSIS — Z170 Estrogen receptor positive status [ER+]: Secondary | ICD-10-CM | POA: Insufficient documentation

## 2023-01-21 ENCOUNTER — Ambulatory Visit: Payer: BC Managed Care – PPO

## 2023-02-11 ENCOUNTER — Ambulatory Visit
Admission: RE | Admit: 2023-02-11 | Discharge: 2023-02-11 | Disposition: A | Discharge status: Home / Self Care | Payer: BC Managed Care – PPO | Source: Ambulatory Visit | Attending: Hematology and Oncology | Admitting: Hematology and Oncology

## 2023-02-11 NOTE — Progress Notes (Signed)
Radiation Oncology         [phone] ________________________________  Name: Jennifer Kane MRN: [id number]  Date of Service: 02/11/2023  DOB: 01-11-1976  Post Treatment Telephone Note  Diagnosis:  Stage IA, pT1cN0M0, grade 2 ER/PR positive invasive ductal carcinoma of the right breast. (as documented in provider EOT note)   The patient was available for call today.   Symptoms of fatigue have improved since completing therapy.  Symptoms of skin changes have improved since completing therapy.  The patient was encouraged to avoid sun exposure in the area of prior treatment for up to one year following radiation with either sunscreen or by the style of clothing worn in the sun.  The patient has scheduled follow up with her medical oncologist Dr. Pamelia Hoit for ongoing surveillance, and was encouraged to call if she develops concerns or questions regarding radiation.   This concludes the interaction.  Ruel Favors, LPN

## 2023-03-11 ENCOUNTER — Telehealth: Payer: Self-pay | Admitting: *Deleted

## 2023-03-11 NOTE — Telephone Encounter (Signed)
Received call from pt stating she underwent a vaginal [place] with her gynecologist and has discovered pt has fibroids as well as an ovarian cyst.  Pt currently on Tamoxifen and requesting advice from MD if she should continue with Tamoxifen at this time.  RN will review with MD.

## 2023-03-11 NOTE — Telephone Encounter (Signed)
Per MD pt to continue tamoxifen with recent history of uterine fibroids and ovarian cyst.  Pt educated and verbalized understanding.

## 2023-03-12 ENCOUNTER — Encounter: Payer: Self-pay | Admitting: Internal Medicine

## 2023-03-25 ENCOUNTER — Other Ambulatory Visit: Payer: Self-pay | Admitting: *Deleted

## 2023-03-25 ENCOUNTER — Ambulatory Visit: Payer: BC Managed Care – PPO | Attending: Surgery

## 2023-03-25 VITALS — Wt 222.1 lb

## 2023-03-25 DIAGNOSIS — C50211 Malignant neoplasm of upper-inner quadrant of right female breast: Secondary | ICD-10-CM

## 2023-03-25 DIAGNOSIS — Z483 Aftercare following surgery for neoplasm: Secondary | ICD-10-CM | POA: Insufficient documentation

## 2023-03-25 NOTE — Therapy (Signed)
OUTPATIENT PHYSICAL THERAPY SOZO SCREENING NOTE   Patient Name: Jennifer Kane MRN: [id number] DOB:12-14-1975, 47 y.o., female Today's Date: 03/25/2023  PCP: Sheela Stack REFERRING PROVIDER: Abigail Miyamoto, MD   PT End of Session - 03/25/23 1559     Visit Number 3   # unchanged due to screen oly   PT Start Time 1557    PT Stop Time 1605    PT Time Calculation (min) 8 min    Activity Tolerance Patient tolerated treatment well    Behavior During Therapy [facility] for tasks assessed/performed             Past Medical History:  Diagnosis Date   Anxiety    Asthma    Breast cancer (HCC) 08/07/2022   Diabetes (HCC)    Dysrhythmia    Hx of Irregular Heartbeat   Family history of brain cancer    Family history of breast cancer    Family history of ovarian cancer    GERD (gastroesophageal reflux disease)    History of panic attacks    History of tachycardia    Details not clear, reported abnormal tilt table test at age 38, on chronic low-dose beta-blocker   PONV (postoperative nausea and vomiting)    Past Surgical History:  Procedure Laterality Date   BREAST BIOPSY Right 08/07/2022   [place] RT BREAST BX W LOC DEV 1ST LESION IMG BX SPEC US GUIDE 08/07/2022 [facility]   BREAST BIOPSY  09/04/2022   [place] RT RADIOACTIVE SEED LOC 09/04/2022 [facility]   BREAST LUMPECTOMY WITH RADIOACTIVE SEED AND SENTINEL LYMPH NODE BIOPSY Right 09/06/2022   Procedure: RIGHT BREAST LUMPECTOMY WITH RADIOACTIVE SEED AND SENTINEL LYMPH NODE BIOPSY;  Surgeon: Abigail Miyamoto, MD;  Location: [facility];  Service: General;  Laterality: Right;   CHOLECYSTECTOMY N/A 05/01/2018   Procedure: LAPAROSCOPIC CHOLECYSTECTOMY;  Surgeon: Rodman Pickle, MD;  Location: [facility];  Service: General;  Laterality: N/A;   KNEE ARTHROSCOPY  1989   No previous surgeries     WISDOM TOOTH EXTRACTION     Patient Active Problem List   Diagnosis Date Noted   Genetic testing 09/24/2022   Family  history of breast cancer 09/12/2022   Family history of ovarian cancer 09/12/2022   Family history of brain cancer 09/12/2022   Malignant neoplasm of upper-inner quadrant of right breast in female, estrogen receptor positive (HCC) 08/24/2022   Moderate persistent asthma, uncomplicated 01/31/2022   Seasonal and perennial allergic rhinitis 01/31/2022   Anaphylactic shock due to adverse food reaction 01/31/2022    REFERRING DIAG: right breast cancer at risk for lymphedema  THERAPY DIAG: Aftercare following surgery for neoplasm  PERTINENT HISTORY:  Patient was diagnosed with right grade 2 IDC. It measures 1.1 cm. It is ER/PR positive with a Ki67 of 10%. Rt breast lumpectomy and SLNB on 09/06/22 with 7 negative nodes removed No benefit to chemotherapy and pt will have radiation and antiestrogen therapy. Other hx includes asthma, DM. Height: 5'3"   PRECAUTIONS: right UE Lymphedema risk,   SUBJECTIVE: Pt returns for her 3 month L-Dex screen. "My Rt breast has felt heavy and harder in spots since completing radiation. Also is just larger in general. Can you look at it?"  PAIN:  Are you having pain?   SOZO SCREENING: Patient was assessed today using the SOZO machine to determine the lymphedema index score. This was compared to her baseline score. It was determined that she is within the recommended range when compared to her  baseline and no further action is needed at this time. She will continue SOZO screenings. These are done every 3 months for 2 years post operatively followed by every 6 months for 2 years, and then annually.  Patient reported a change in status to PTA which initiated the PTA consulting with a PT. PT determined it would be appropriate to initiate therapy at this time. PT requested a referral from patient's provider.    L-DEX FLOWSHEETS - 03/25/23 1600       L-DEX LYMPHEDEMA SCREENING   Measurement Type Unilateral    L-DEX MEASUREMENT EXTREMITY Upper Extremity    POSITION   Standing    DOMINANT SIDE Right    At Risk Side Right    BASELINE SCORE (UNILATERAL) 0.7    L-DEX SCORE (UNILATERAL) 3.1    VALUE CHANGE (UNILAT) 2.4               Hermenia Bers, PTA 03/25/2023, 4:07 PM

## 2023-04-02 ENCOUNTER — Other Ambulatory Visit: Payer: Self-pay | Admitting: Surgery

## 2023-04-02 DIAGNOSIS — N6310 Unspecified lump in the right breast, unspecified quadrant: Secondary | ICD-10-CM

## 2023-04-03 ENCOUNTER — Ambulatory Visit (AMBULATORY_SURGERY_CENTER): Payer: BC Managed Care – PPO

## 2023-04-03 ENCOUNTER — Telehealth: Payer: Self-pay | Admitting: *Deleted

## 2023-04-03 VITALS — Ht 63.0 in | Wt 220.0 lb

## 2023-04-03 DIAGNOSIS — Z1211 Encounter for screening for malignant neoplasm of colon: Secondary | ICD-10-CM

## 2023-04-03 MED ORDER — NA SULFATE-K SULFATE-MG SULF 17.5-3.13-1.6 GM/177ML PO SOLN: 1.0000 | Freq: Once | ORAL | 0 refills | Status: AC

## 2023-04-03 NOTE — Telephone Encounter (Signed)
Received call from pt with complaint of pain at incision site on right breast x several weeks.  Pt states the right breast is firm to the touch and denies any recent injury or trauma.  States her surgeon Dr. Magnus Ivan has ordered an diagnostic US and Mammogram for further evaluation and wanted to make our office aware.  RN will update MD.

## 2023-04-03 NOTE — Progress Notes (Signed)

## 2023-04-08 ENCOUNTER — Encounter: Payer: BC Managed Care – PPO | Admitting: Adult Health

## 2023-04-15 ENCOUNTER — Encounter: Payer: Self-pay | Admitting: Internal Medicine

## 2023-04-15 NOTE — Therapy (Signed)
OUTPATIENT PHYSICAL THERAPY  UPPER EXTREMITY ONCOLOGY EVALUATION  Patient Name: Jennifer Kane MRN: [id number] DOB:[birth year], 47 y.o., female Today's Date: 04/16/2023  END OF SESSION:  PT End of Session - 04/16/23 1644     Visit Number 1    Number of Visits 12    Date for PT Re-Evaluation 05/28/23    PT Start Time 1605    PT Stop Time 1643    PT Time Calculation (min) 38 min    Activity Tolerance Patient tolerated treatment well    Behavior During Therapy [facility] for tasks assessed/performed             Past Medical History:  Diagnosis Date   Anxiety    Asthma    Breast cancer (HCC) 08/07/2022   Diabetes (HCC)    Dysrhythmia    Hx of Irregular Heartbeat   Family history of brain cancer    Family history of breast cancer    Family history of ovarian cancer    GERD (gastroesophageal reflux disease)    History of panic attacks    History of tachycardia    Details not clear, reported abnormal tilt table test at age 36, on chronic low-dose beta-blocker   PONV (postoperative nausea and vomiting)    Past Surgical History:  Procedure Laterality Date   BREAST BIOPSY Right 08/07/2022   [place] RT BREAST BX W LOC DEV 1ST LESION IMG BX SPEC US GUIDE 08/07/2022 [facility]   BREAST BIOPSY  09/04/2022   [place] RT RADIOACTIVE SEED LOC 09/04/2022 [facility]   BREAST LUMPECTOMY WITH RADIOACTIVE SEED AND SENTINEL LYMPH NODE BIOPSY Right 09/06/2022   Procedure: RIGHT BREAST LUMPECTOMY WITH RADIOACTIVE SEED AND SENTINEL LYMPH NODE BIOPSY;  Surgeon: Abigail Miyamoto, MD;  Location: [facility];  Service: General;  Laterality: Right;   CHOLECYSTECTOMY N/A 05/01/2018   Procedure: LAPAROSCOPIC CHOLECYSTECTOMY;  Surgeon: Rodman Pickle, MD;  Location: [facility];  Service: General;  Laterality: N/A;   KNEE ARTHROSCOPY  1989   No previous surgeries     WISDOM TOOTH EXTRACTION     Patient Active Problem List   Diagnosis Date Noted   Genetic testing 09/24/2022   Family history of breast  cancer 09/12/2022   Family history of ovarian cancer 09/12/2022   Family history of brain cancer 09/12/2022   Malignant neoplasm of upper-inner quadrant of right breast in female, estrogen receptor positive (HCC) 08/24/2022   Moderate persistent asthma, uncomplicated 01/31/2022   Seasonal and perennial allergic rhinitis 01/31/2022   Anaphylactic shock due to adverse food reaction 01/31/2022     REFERRING PROVIDER: Serena Croissant, MD  REFERRING DIAG: right breast Lymphedema s/p Lumpectomy  THERAPY DIAG:  Malignant neoplasm of upper-inner quadrant of right breast in female, estrogen receptor positive [facility])  Aftercare following surgery for neoplasm  Localized edema  ONSET DATE: 12/2022  Rationale for Evaluation and Treatment: Rehabilitation  SUBJECTIVE:  SUBJECTIVE STATEMENT:  Pt reported at her [facility] screen that her Rt breast has felt heavy and harder in spots since completing radiation. It itches and stings some. Also is just larger in general. Pt. Lives in [place]. She is going to have an [place] and Mammogram on Apr 22, 2023. She went back to a sports bra because she didn't feel the compression bra was tight enough. Radiation ended in August. At night when lying down she notices tightness in the right pectorals. She is doing some stretches but not consistently  PERTINENT HISTORY:  Patient was diagnosed with right grade 2 IDC. It measures 1.1 cm. It is ER/PR positive with a Ki67 of 10%. Rt breast lumpectomy and SLNB on 09/06/22 with 7 negative nodes removed No benefit to chemotherapy and pt had radiation and antiestrogen therapy. Other hx includes asthma, DM   PAIN:  Are you having pain? Yes NPRS scale: 4/10 Pain location: Right breast Pain orientation: Right and Lateral  PAIN TYPE: aching and tight Pain  description: constant  Aggravating factors: moving arm, laying down, laying on stomach Relieving factors: not sure  PRECAUTIONS: Right UE Lymphedema risk, DM, Asthma, prior injury to right shoulder doing crossfit  RED FLAGS: None   WEIGHT BEARING RESTRICTIONS: No  FALLS:  Has patient fallen in last 6 months? No  LIVING ENVIRONMENT: Lives with: lives with their spouse   OCCUPATION: kindergarten teacher   LEISURE: nothing right now  HAND DOMINANCE: right   PRIOR LEVEL OF FUNCTION: Independent  PATIENT GOALS: To decrease right breast swelling, discomfort   OBJECTIVE: Note: Objective measures were completed at Evaluation unless otherwise noted.  COGNITION: Overall cognitive status: Within functional limits for tasks assessed   PALPATION: Tender right pectorals, inferior axilla, lateral arm at mid delt  OBSERVATIONS / OTHER ASSESSMENTS: generalized right breast swelling and axillary swelling, mild peau d'orange inferior medial breast, New burn on right breast from spilling [place] oil on Thanksgiving, mild redness right breast. Fibrosis noted in axilla, and medial breast  SENSATION: Light touch: Deficits    POSTURE: Forward head, rounded shoulders  UPPER EXTREMITY AROM/PROM:  A/PROM RIGHT   eval   Shoulder extension 50, tight outer arm  Shoulder flexion 160 tight pecs  Shoulder abduction 170  Shoulder internal rotation 60 pain  Shoulder external rotation NT    (Blank rows = not tested)  A/PROM LEFT   eval  Shoulder extension 43  Shoulder flexion 167  Shoulder abduction 177  Shoulder internal rotation 60  Shoulder external rotation 100    (Blank rows = not tested)  CERVICAL AROM: All within functional limits:      UPPER EXTREMITY STRENGTH:   LYMPHEDEMA ASSESSMENTS:   SURGERY TYPE/DATE: Rt breast lumpectomy and SLNB on 09/06/22   NUMBER OF LYMPH NODES REMOVED: 0/7  CHEMOTHERAPY: No  RADIATION:YES, 11/06/2022-12/04/2022  HORMONE TREATMENT: YES,  Tamoxifen  INFECTIONS: NO   LYMPHEDEMA ASSESSMENTS:   LANDMARK RIGHT  eval  At axilla  39.7  15 cm proximal to olecranon process   10 cm proximal to olecranon process 36.5  Olecranon process 30.3  15 cm proximal to ulnar styloid process 30.5  10 cm proximal to ulnar styloid process 28.2  Just proximal to ulnar styloid process 19.7  Across hand at thumb web space 21.4  At base of 2nd digit 7.2  (Blank rows = not tested)  LANDMARK LEFT  eval  At axilla  39.9  15 cm proximal to olecranon process   10 cm proximal to olecranon process 36.3  Olecranon process 29.8  15 cm proximal to ulnar styloid process 28.8  10 cm proximal to ulnar styloid process 27.0  Just proximal to ulnar styloid process 19.8  Across hand at thumb web space 20.1  At base of 2nd digit 7  (Blank rows = not tested)   FUNCTIONAL TESTS:    GAIT: WNL  QUICK DASH SURVEY: 11.36  BREAST COMPLAINTS QUESTIONNAIRE Pain:4 Heaviness:3 Swollen feeling:3 Tense Skin:3 Redness:2 Bra Print:4 Size of Pores:1 Hard feeling: 5 Total:   25  /80 A Score over 9 indicates lymphedema issues in the breast    TODAY'S TREATMENT:                                                                                                                                          DATE:  04/16/2023 Pt educated in supine stargazer stretch and abduction wall stretch to stretch tight muscles and to perform 2x/day holding 5-10 secs. Showed chart of lymphatics and explained the gentle nature of MLD that we will be doing for breast swelling due to superficial nature of lymphatics.    PATIENT EDUCATION:  Education details: Editor, commissioning, wall slides for abd, Role of lymphatics Person educated: Patient Education method: Explanation and Handouts Education comprehension: verbalized understanding and returned demonstration  HOME EXERCISE PROGRAM: Stargazer stretch, wall slide for abd  ASSESSMENT:  CLINICAL IMPRESSION: Patient is a 47 y.o.  female who was seen today for physical therapy evaluation and treatment for complaints of heaviness, swelling and firmness in the right breast. Symptoms were noted after the completion of Radiation.in August.  She has mild peau d'orange at the inferior medial breast and mildly enlarged pores. There is generalized right breast swelling and right axillary swelling with some fibrosis noted..She also presents with mild decreases in Right shoulder ROM with tightness in the right pectorals and axillary region. She will benefit from skilled PT to address deficits and return to PLOF   OBJECTIVE IMPAIRMENTS: decreased activity tolerance, decreased knowledge of condition, decreased ROM, increased edema, impaired flexibility, impaired sensation, postural dysfunction, and pain.   ACTIVITY LIMITATIONS: lifting, sleeping, bed mobility, reach over head, and hygiene/grooming  PARTICIPATION LIMITATIONS: cleaning and extremes of reaching  PERSONAL FACTORS: 1-2 comorbidities: Right breast Cancer s/p Lumpectomy and radiation  are also affecting patient's functional outcome.   REHAB POTENTIAL: Good  CLINICAL DECISION MAKING: Stable/uncomplicated  EVALUATION COMPLEXITY: Low  GOALS: Goals reviewed with patient? Yes  SHORT TERM GOALS=LONG TERM GOALS: Target date: 05/27/2022  Pt will be independent with use of compression bra/sports bra to decrease swelling Baseline: Goal status: INITIAL  2.  Breast Complaints survey will be no greater than 10  to demonstrate improved swelling Baseline: 25 Goal status: INITIAL  3.  Pt will be independent in MLD to the right breast to decrease swelling. Baseline:  Goal status: INITIAL  4.  Pt will have no complaints of pectoral/lateral trunk tightness with  right shoulder ROM Baseline:  Goal status: INITIAL  5.  Pt will report decreased swelling by atleast 50% Baseline:  Goal status: INITIAL  6.  Pt will have greater than 50% improvement in Right UQ pain Baseline:   Goal status: INITIAL   PLAN:  PT FREQUENCY: 2x/week  PT DURATION: 6 weeks  PLANNED INTERVENTIONS: 16109- PT Re-evaluation, 97110-Therapeutic exercises, 60454- Neuromuscular re-education, 97535- Self Care, 09811- Manual therapy, 97760- Orthotic Fit/training, Patient/Family education, Joint mobilization, Manual lymph drainage, Scar mobilization, Therapeutic exercises, Neuromuscular re-education, and Self Care  PLAN FOR NEXT SESSION: STM right pectorals, lateral trunk, UT, PROM, AAROM and AROM, initiate MLD to right breast and start instructing pt.  Waynette Buttery, PT 04/16/2023, 4:46 PM

## 2023-04-16 ENCOUNTER — Other Ambulatory Visit: Payer: Self-pay

## 2023-04-16 ENCOUNTER — Ambulatory Visit: Payer: BC Managed Care – PPO | Attending: Surgery

## 2023-04-16 DIAGNOSIS — N63 Unspecified lump in unspecified breast: Secondary | ICD-10-CM | POA: Diagnosis present

## 2023-04-16 DIAGNOSIS — R600 Localized edema: Secondary | ICD-10-CM | POA: Insufficient documentation

## 2023-04-16 DIAGNOSIS — I890 Lymphedema, not elsewhere classified: Secondary | ICD-10-CM | POA: Insufficient documentation

## 2023-04-16 DIAGNOSIS — Z170 Estrogen receptor positive status [ER+]: Secondary | ICD-10-CM | POA: Insufficient documentation

## 2023-04-16 DIAGNOSIS — Z483 Aftercare following surgery for neoplasm: Secondary | ICD-10-CM | POA: Diagnosis present

## 2023-04-16 DIAGNOSIS — C50211 Malignant neoplasm of upper-inner quadrant of right female breast: Secondary | ICD-10-CM | POA: Diagnosis present

## 2023-04-22 ENCOUNTER — Ambulatory Visit
Admission: RE | Admit: 2023-04-22 | Discharge: 2023-04-22 | Disposition: A | Discharge status: Home / Self Care | Payer: BC Managed Care – PPO | Source: Ambulatory Visit | Attending: Surgery | Admitting: Surgery

## 2023-04-22 ENCOUNTER — Ambulatory Visit: Payer: BC Managed Care – PPO

## 2023-04-22 DIAGNOSIS — I890 Lymphedema, not elsewhere classified: Secondary | ICD-10-CM

## 2023-04-22 DIAGNOSIS — N63 Unspecified lump in unspecified breast: Secondary | ICD-10-CM

## 2023-04-22 DIAGNOSIS — C50211 Malignant neoplasm of upper-inner quadrant of right female breast: Secondary | ICD-10-CM | POA: Diagnosis not present

## 2023-04-22 DIAGNOSIS — N6310 Unspecified lump in the right breast, unspecified quadrant: Secondary | ICD-10-CM

## 2023-04-22 DIAGNOSIS — R600 Localized edema: Secondary | ICD-10-CM

## 2023-04-22 DIAGNOSIS — Z483 Aftercare following surgery for neoplasm: Secondary | ICD-10-CM

## 2023-04-22 NOTE — Therapy (Signed)
OUTPATIENT PHYSICAL THERAPY  UPPER EXTREMITY ONCOLOGY TREATMENT  Patient Name: Jennifer Kane MRN: [id number] DOB:1975/09/13, 47 y.o., female Today's Date: 04/22/2023  END OF SESSION:  PT End of Session - 04/22/23 1611     Visit Number 2    Number of Visits 12    Date for PT Re-Evaluation 05/28/23    PT Start Time 1611   late, another appt   PT Stop Time 1700    PT Time Calculation (min) 49 min    Activity Tolerance Patient tolerated treatment well    Behavior During Therapy [facility] for tasks assessed/performed             Past Medical History:  Diagnosis Date   Anxiety    Asthma    Breast cancer (HCC) 08/07/2022   Diabetes (HCC)    Dysrhythmia    Hx of Irregular Heartbeat   Family history of brain cancer    Family history of breast cancer    Family history of ovarian cancer    GERD (gastroesophageal reflux disease)    History of panic attacks    History of tachycardia    Details not clear, reported abnormal tilt table test at age 2, on chronic low-dose beta-blocker   PONV (postoperative nausea and vomiting)    Past Surgical History:  Procedure Laterality Date   BREAST BIOPSY Right 08/07/2022   [place] RT BREAST BX W LOC DEV 1ST LESION IMG BX SPEC US GUIDE 08/07/2022 [facility]   BREAST BIOPSY  09/04/2022   [place] RT RADIOACTIVE SEED LOC 09/04/2022 [facility]   BREAST LUMPECTOMY WITH RADIOACTIVE SEED AND SENTINEL LYMPH NODE BIOPSY Right 09/06/2022   Procedure: RIGHT BREAST LUMPECTOMY WITH RADIOACTIVE SEED AND SENTINEL LYMPH NODE BIOPSY;  Surgeon: Abigail Miyamoto, MD;  Location: [facility];  Service: General;  Laterality: Right;   CHOLECYSTECTOMY N/A 05/01/2018   Procedure: LAPAROSCOPIC CHOLECYSTECTOMY;  Surgeon: Rodman Pickle, MD;  Location: [facility];  Service: General;  Laterality: N/A;   KNEE ARTHROSCOPY  1989   No previous surgeries     WISDOM TOOTH EXTRACTION     Patient Active Problem List   Diagnosis Date Noted   Genetic testing 09/24/2022   Family  history of breast cancer 09/12/2022   Family history of ovarian cancer 09/12/2022   Family history of brain cancer 09/12/2022   Malignant neoplasm of upper-inner quadrant of right breast in female, estrogen receptor positive (HCC) 08/24/2022   Moderate persistent asthma, uncomplicated 01/31/2022   Seasonal and perennial allergic rhinitis 01/31/2022   Anaphylactic shock due to adverse food reaction 01/31/2022     REFERRING PROVIDER: Serena Croissant, MD  REFERRING DIAG: right breast Lymphedema s/p Lumpectomy  THERAPY DIAG:  Malignant neoplasm of upper-inner quadrant of right breast in female, estrogen receptor positive (HCC)  Aftercare following surgery for neoplasm  Localized edema  Breast swelling  Lymphedema, not elsewhere classified  ONSET DATE: 12/2022  Rationale for Evaluation and Treatment: Rehabilitation  SUBJECTIVE:  SUBJECTIVE STATEMENT:  The mammogram and [place] was good. They didn't see anything. I am just coming from there, thatas why I am late. Doing exercises 2 times a day. Can't tell much difference yet.   PERTINENT HISTORY:  Patient was diagnosed with right grade 2 IDC. It measures 1.1 cm. It is ER/PR positive with a Ki67 of 10%. Rt breast lumpectomy and SLNB on 09/06/22 with 7 negative nodes removed No benefit to chemotherapy and pt had radiation and antiestrogen therapy. Other hx includes asthma, DM, Right shoulder pain from cross fit  PAIN:  Are you having pain? Yes NPRS scale: 1/10 Just tender today. Pain location: Right breast Pain orientation: Right and Lateral  PAIN TYPE: aching and tight Pain description: constant  Aggravating factors: moving arm, laying down, laying on stomach Relieving factors: not sure  PRECAUTIONS: Right UE Lymphedema risk, DM, Asthma, prior injury to  right shoulder doing crossfit  RED FLAGS: None   WEIGHT BEARING RESTRICTIONS: No  FALLS:  Has patient fallen in last 6 months? No  LIVING ENVIRONMENT: Lives with: lives with their spouse   OCCUPATION: kindergarten teacher   LEISURE: nothing right now  HAND DOMINANCE: right   PRIOR LEVEL OF FUNCTION: Independent  PATIENT GOALS: To decrease right breast swelling, discomfort   OBJECTIVE: Note: Objective measures were completed at Evaluation unless otherwise noted.  COGNITION: Overall cognitive status: Within functional limits for tasks assessed   PALPATION: Tender right pectorals, inferior axilla, lateral arm at mid delt  OBSERVATIONS / OTHER ASSESSMENTS: generalized right breast swelling and axillary swelling, mild peau d'orange inferior medial breast, New burn on right breast from spilling [place] oil on Thanksgiving, mild redness right breast. Fibrosis noted in axilla, and medial breast  SENSATION: Light touch: Deficits    POSTURE: Forward head, rounded shoulders  UPPER EXTREMITY AROM/PROM:  A/PROM RIGHT   eval   Shoulder extension 50, tight outer arm  Shoulder flexion 160 tight pecs  Shoulder abduction 170  Shoulder internal rotation 60 pain  Shoulder external rotation NT    (Blank rows = not tested)  A/PROM LEFT   eval  Shoulder extension 43  Shoulder flexion 167  Shoulder abduction 177  Shoulder internal rotation 60  Shoulder external rotation 100    (Blank rows = not tested)  CERVICAL AROM: All within functional limits:      UPPER EXTREMITY STRENGTH:   LYMPHEDEMA ASSESSMENTS:   SURGERY TYPE/DATE: Rt breast lumpectomy and SLNB on 09/06/22   NUMBER OF LYMPH NODES REMOVED: 0/7  CHEMOTHERAPY: No  RADIATION:YES, 11/06/2022-12/04/2022  HORMONE TREATMENT: YES, Tamoxifen  INFECTIONS: NO   LYMPHEDEMA ASSESSMENTS:   LANDMARK RIGHT  eval  At axilla  39.7  15 cm proximal to olecranon process   10 cm proximal to olecranon process 36.5   Olecranon process 30.3  15 cm proximal to ulnar styloid process 30.5  10 cm proximal to ulnar styloid process 28.2  Just proximal to ulnar styloid process 19.7  Across hand at thumb web space 21.4  At base of 2nd digit 7.2  (Blank rows = not tested)  LANDMARK LEFT  eval  At axilla  39.9  15 cm proximal to olecranon process   10 cm proximal to olecranon process 36.3  Olecranon process 29.8  15 cm proximal to ulnar styloid process 28.8  10 cm proximal to ulnar styloid process 27.0  Just proximal to ulnar styloid process 19.8  Across hand at thumb web space 20.1  At base of 2nd digit 7  (Blank rows =  not tested)   FUNCTIONAL TESTS:    GAIT: WNL  QUICK DASH SURVEY: 11.36  BREAST COMPLAINTS QUESTIONNAIRE Pain:4 Heaviness:3 Swollen feeling:3 Tense Skin:3 Redness:2 Bra Print:4 Size of Pores:1 Hard feeling: 5 Total:   25  /80 A Score over 9 indicates lymphedema issues in the breast    TODAY'S TREATMENT:                                                                                                                                          DATE:   04/22/2023 STM with cocoabutter to right UT, pectorals and lateral trunk Supine wand flexion x 3, scaption x 2 complained of shoulder pain so held Tried PROM but pt too guarded to allow me to perform PROM Initiated lower trunk rotations with knees left x 3 to stretch right pectorals. Initiated MLD to right breast while instructing pt and having her try all techniques. In supine: Short neck, 5 diaphragmatic breaths, L axillary nodes and establishment of interaxillary pathway, R inguinal nodes and establishment of axilloinguinal pathway, then R breast moving fluid towards pathways spending extra time in any areas of fibrosis then retracing all steps and ending with LN's Therapist demonstrated and pt practiced all. She required VC's and TC's initially, but improved with practice and used good pressure. She had some difficulty  getting her hand flat while doing the medial side of her breast. Gave pt written handout for MLD. Reminded to continue stretches to get pecs loosened up.     04/16/2023 Pt educated in supine stargazer stretch and abduction wall stretch to stretch tight muscles and to perform 2x/day holding 5-10 secs. Showed chart of lymphatics and explained the gentle nature of MLD that we will be doing for breast swelling due to superficial nature of lymphatics.    PATIENT EDUCATION:  Education details: Editor, commissioning, wall slides for abd, Role of lymphatics Person educated: Patient Education method: Explanation and Handouts Education comprehension: verbalized understanding and returned demonstration  HOME EXERCISE PROGRAM: Stargazer stretch, wall slide for abd  ASSESSMENT:  CLINICAL IMPRESSION: Pt experienced pain with supine wand even when trying to use scapular depression. Attempted PROM however, pt was too guarded and I could not range her shoulder. She did well with lower trunk rotation to stretch her pectorals. She also did quite well with her MLD techniques using good pressure and had a good understanding of the sequence.  OBJECTIVE IMPAIRMENTS: decreased activity tolerance, decreased knowledge of condition, decreased ROM, increased edema, impaired flexibility, impaired sensation, postural dysfunction, and pain.   ACTIVITY LIMITATIONS: lifting, sleeping, bed mobility, reach over head, and hygiene/grooming  PARTICIPATION LIMITATIONS: cleaning and extremes of reaching  PERSONAL FACTORS: 1-2 comorbidities: Right breast Cancer s/p Lumpectomy and radiation  are also affecting patient's functional outcome.   REHAB POTENTIAL: Good  CLINICAL DECISION MAKING: Stable/uncomplicated  EVALUATION COMPLEXITY: Low  GOALS: Goals reviewed with patient? Yes  SHORT TERM GOALS=LONG  TERM GOALS: Target date: 05/27/2022  Pt will be independent with use of compression bra/sports bra to decrease  swelling Baseline: Goal status: INITIAL  2.  Breast Complaints survey will be no greater than 10  to demonstrate improved swelling Baseline: 25 Goal status: INITIAL  3.  Pt will be independent in MLD to the right breast to decrease swelling. Baseline:  Goal status: INITIAL  4.  Pt will have no complaints of pectoral/lateral trunk tightness with right shoulder ROM Baseline:  Goal status: INITIAL  5.  Pt will report decreased swelling by atleast 50% Baseline:  Goal status: INITIAL  6.  Pt will have greater than 50% improvement in Right UQ pain Baseline:  Goal status: INITIAL   PLAN:  PT FREQUENCY: 2x/week  PT DURATION: 6 weeks  PLANNED INTERVENTIONS: 16109- PT Re-evaluation, 97110-Therapeutic exercises, 97112- Neuromuscular re-education, 97535- Self Care, 60454- Manual therapy, 97760- Orthotic Fit/training, Patient/Family education, Joint mobilization, Manual lymph drainage, Scar mobilization, Therapeutic exercises, Neuromuscular re-education, and Self Care  PLAN FOR NEXT SESSION: STM right pectorals, lateral trunk, UT,  AAROM and AROM (assess shoulder pain) review MLD to right breast and continue instructing pt. Give pics of LTR and doorway stretch.  Waynette Buttery, PT 04/22/2023, 5:02 PM

## 2023-04-24 ENCOUNTER — Ambulatory Visit: Payer: BC Managed Care – PPO

## 2023-04-26 ENCOUNTER — Encounter: Payer: Self-pay | Admitting: Internal Medicine

## 2023-04-26 ENCOUNTER — Ambulatory Visit: Payer: BC Managed Care – PPO | Admitting: Internal Medicine

## 2023-04-26 VITALS — BP 114/52 | HR 60 | Temp 98.6°F | Resp 11 | Ht 63.0 in | Wt 220.0 lb

## 2023-04-26 DIAGNOSIS — K648 Other hemorrhoids: Secondary | ICD-10-CM | POA: Diagnosis not present

## 2023-04-26 DIAGNOSIS — Z3202 Encounter for pregnancy test, result negative: Secondary | ICD-10-CM | POA: Diagnosis not present

## 2023-04-26 DIAGNOSIS — Z1211 Encounter for screening for malignant neoplasm of colon: Secondary | ICD-10-CM | POA: Diagnosis present

## 2023-04-26 DIAGNOSIS — D123 Benign neoplasm of transverse colon: Secondary | ICD-10-CM | POA: Diagnosis not present

## 2023-04-26 DIAGNOSIS — K5730 Diverticulosis of large intestine without perforation or abscess without bleeding: Secondary | ICD-10-CM | POA: Diagnosis not present

## 2023-04-26 LAB — POCT URINE PREGNANCY: Preg Test, Ur: NEGATIVE

## 2023-04-26 MED ORDER — SODIUM CHLORIDE 0.9 % IV SOLN: 500.0000 mL | INTRAVENOUS | Status: DC

## 2023-04-26 NOTE — Patient Instructions (Signed)
   Handouts provided about hemorrhoids, diverticulosis and polyps.  Await pathology results.  YOU HAD AN ENDOSCOPIC PROCEDURE TODAY AT THE [facility]:   Refer to the procedure report that was given to you for any specific questions about what was found during the examination.  If the procedure report does not answer your questions, please call your gastroenterologist to clarify.  If you requested that your care partner not be given the details of your procedure findings, then the procedure report has been included in a sealed envelope for you to review at your convenience later.  YOU SHOULD EXPECT: Some feelings of bloating in the abdomen. Passage of more gas than usual.  Walking can help get rid of the air that was put into your GI tract during the procedure and reduce the bloating. If you had a lower endoscopy (such as a colonoscopy or flexible sigmoidoscopy) you may notice spotting of blood in your stool or on the toilet paper. If you underwent a bowel prep for your procedure, you may not have a normal bowel movement for a few days.  Please Note:  You might notice some irritation and congestion in your nose or some drainage.  This is from the oxygen used during your procedure.  There is no need for concern and it should clear up in a day or so.  SYMPTOMS TO REPORT IMMEDIATELY:  Following lower endoscopy (colonoscopy or flexible sigmoidoscopy):  Excessive amounts of blood in the stool  Significant tenderness or worsening of abdominal pains  Swelling of the abdomen that is new, acute  Fever of 100F or higher  For urgent or emergent issues, a gastroenterologist can be reached at any hour by calling [phone]. Do not use MyChart messaging for urgent concerns.    DIET:  We do recommend a small meal at first, but then you may proceed to your regular diet.  Drink plenty of fluids but you should avoid alcoholic beverages for 24 hours.  ACTIVITY:  You should plan to take it easy  for the rest of today and you should NOT DRIVE or use heavy machinery until tomorrow (because of the sedation medicines used during the test).    FOLLOW UP: Our staff will call the number listed on your records the next business day following your procedure.  We will call around 7:15- 8:00 am to check on you and address any questions or concerns that you may have regarding the information given to you following your procedure. If we do not reach you, we will leave a message.     If any biopsies were taken you will be contacted by phone or by letter within the next 1-3 weeks.  Please call us at [phone] if you have not heard about the biopsies in 3 weeks.    SIGNATURES/CONFIDENTIALITY: You and/or your care partner have signed paperwork which will be entered into your electronic medical record.  These signatures attest to the fact that that the information above on your After Visit Summary has been reviewed and is understood.  Full responsibility of the confidentiality of this discharge information lies with you and/or your care-partner.

## 2023-04-26 NOTE — Progress Notes (Signed)
Pt's states no medical or surgical changes since previsit or office visit. 

## 2023-04-26 NOTE — Progress Notes (Signed)
Called to room to assist during endoscopic procedure.  Patient ID and intended procedure confirmed with present staff. Received instructions for my participation in the procedure from the performing physician.  

## 2023-04-26 NOTE — Progress Notes (Signed)
Sedate, gd SR, tolerated procedure well, VSS, report to RN 

## 2023-04-26 NOTE — Op Note (Signed)
[facility] Patient Name: Jennifer Kane Procedure Date: 04/26/2023 10:55 AM MRN: [id number] Endoscopist: Madelyn Brunner [place] , , [id number] Age: 47 Referring MD:  Date of Birth: 1975/06/07 Gender: Female Account #: [id number] Procedure:                Colonoscopy Indications:              Screening for colorectal malignant neoplasm, This                            is the patient's first colonoscopy Medicines:                Monitored Anesthesia Care Procedure:                Pre-Anesthesia Assessment:                           - Prior to the procedure, a History and Physical                            was performed, and patient medications and                            allergies were reviewed. The patient's tolerance of                            previous anesthesia was also reviewed. The risks                            and benefits of the procedure and the sedation                            options and risks were discussed with the patient.                            All questions were answered, and informed consent                            was obtained. Prior Anticoagulants: The patient has                            taken no anticoagulant or antiplatelet agents. ASA                            Grade Assessment: II - A patient with mild systemic                            disease. After reviewing the risks and benefits,                            the patient was deemed in satisfactory condition to                            undergo the procedure.  After obtaining informed consent, the colonoscope                            was passed under direct vision. Throughout the                            procedure, the patient's blood pressure, pulse, and                            oxygen saturations were monitored continuously. The                            Olympus Scope SN: [id number] was introduced through                            the anus and  advanced to the the terminal ileum.                            The colonoscopy was performed without difficulty.                            The patient tolerated the procedure well. The                            quality of the bowel preparation was good. The                            terminal ileum, ileocecal valve, appendiceal                            orifice, and rectum were photographed. Scope In: 11:03:55 AM Scope Out: 11:14:24 AM Scope Withdrawal Time: 0 hours 8 minutes 16 seconds  Total Procedure Duration: 0 hours 10 minutes 29 seconds  Findings:                 The terminal ileum appeared normal.                           An 8 mm polyp was found in the transverse colon.                            The polyp was sessile. The polyp was removed with a                            cold snare. Resection and retrieval were complete.                           Multiple diverticula were found in the sigmoid                            colon, descending colon and transverse colon.                           Non-bleeding internal hemorrhoids were found during  retroflexion. Complications:            No immediate complications. Estimated Blood Loss:     Estimated blood loss was minimal. Impression:               - The examined portion of the ileum was normal.                           - One 8 mm polyp in the transverse colon, removed                            with a cold snare. Resected and retrieved.                           - Diverticulosis in the sigmoid colon, in the                            descending colon and in the transverse colon.                           - Non-bleeding internal hemorrhoids. Recommendation:           - Discharge patient to home (with escort).                           - Await pathology results.                           - The findings and recommendations were discussed                            with the patient. Dr Particia Lather  "[name]" [name],  04/26/2023 11:17:35 AM

## 2023-04-26 NOTE — Progress Notes (Signed)
GASTROENTEROLOGY PROCEDURE H&P NOTE   Primary Care Physician: [name], PA-C    Reason for Procedure:   Colon cancer screening  Plan:    Colonoscopy   Patient is appropriate for endoscopic procedure(s) in the ambulatory ([facility]) setting.  The nature of the procedure, as well as the risks, benefits, and alternatives were carefully and thoroughly reviewed with the patient. Ample time for discussion and questions allowed. The patient understood, was satisfied, and agreed to proceed.     HPI: Jennifer Kane is a 47 y.o. female who presents for colonoscopy for colon cancer screening. Denies blood in stools, changes in bowel habits, or unintentional weight loss. Denies family history of colon cancer.  Past Medical History:  Diagnosis Date   Anxiety    Asthma    Breast cancer (HCC) 08/07/2022   Diabetes (HCC)    Dysrhythmia    Hx of Irregular Heartbeat   Family history of brain cancer    Family history of breast cancer    Family history of ovarian cancer    GERD (gastroesophageal reflux disease)    History of panic attacks    History of tachycardia    Details not clear, reported abnormal tilt table test at age 71, on chronic low-dose beta-blocker   PONV (postoperative nausea and vomiting)     Past Surgical History:  Procedure Laterality Date   BREAST BIOPSY Right 08/07/2022   [place] RT BREAST BX W LOC DEV 1ST LESION IMG BX SPEC US GUIDE 08/07/2022 [facility]   BREAST BIOPSY  09/04/2022   [place] RT RADIOACTIVE SEED LOC 09/04/2022 [facility]   BREAST LUMPECTOMY WITH RADIOACTIVE SEED AND SENTINEL LYMPH NODE BIOPSY Right 09/06/2022   Procedure: RIGHT BREAST LUMPECTOMY WITH RADIOACTIVE SEED AND SENTINEL LYMPH NODE BIOPSY;  Surgeon: Abigail Miyamoto, MD;  Location: [facility] OR;  Service: General;  Laterality: Right;   CHOLECYSTECTOMY N/A 05/01/2018   Procedure: LAPAROSCOPIC CHOLECYSTECTOMY;  Surgeon: Rodman Pickle, MD;  Location: [facility];  Service: General;   Laterality: N/A;   KNEE ARTHROSCOPY  1989   No previous surgeries     WISDOM TOOTH EXTRACTION      Prior to Admission medications   Medication Sig Start Date End Date Taking? Authorizing Provider  escitalopram (LEXAPRO) 10 MG tablet Take 5 mg by mouth at bedtime. 01/02/22  Yes [provider]  tamoxifen (NOLVADEX) 20 MG tablet Take 1 tablet (20 mg total) by mouth daily. 12/20/22  Yes Serena Croissant, MD  ALPRAZolam Prudy Feeler) 0.25 MG tablet Take by mouth.    [provider]  cetirizine (ZYRTEC) 5 MG chewable tablet Chew 5 mg by mouth daily. Patient not taking: Reported on 04/26/2023    [provider]  EPINEPHrine (EPIPEN 2-PAK) 0.3 mg/0.3 mL IJ SOAJ injection Inject 0.3 mg into the muscle once. Patient not taking: Reported on 04/26/2023    [provider]  ibuprofen (ADVIL) 200 MG tablet Take 200 mg by mouth every 6 (six) hours as needed for moderate pain.    [provider]  nitrofurantoin, macrocrystal-monohydrate, (MACROBID) 100 MG capsule Take by mouth. 04/25/23 10/22/23  [provider]  Semaglutide,0.25 or 0.5MG /DOS, (OZEMPIC, 0.25 OR 0.5 MG/DOSE,) 2 MG/1.5ML SOPN Inject 0.5 mg into the skin once a week. Patient not taking: Reported on 04/03/2023    [provider]  traMADol (ULTRAM) 50 MG tablet Take 1 tablet (50 mg total) by mouth every 6 (six) hours as needed for moderate pain or severe pain. Patient not taking: Reported on  04/26/2023 09/06/22   Abigail Miyamoto, MD    Current Outpatient Medications  Medication Sig Dispense Refill   escitalopram (LEXAPRO) 10 MG tablet Take 5 mg by mouth at bedtime.     tamoxifen (NOLVADEX) 20 MG tablet Take 1 tablet (20 mg total) by mouth daily. 90 tablet 3   ALPRAZolam (XANAX) 0.25 MG tablet Take by mouth.     cetirizine (ZYRTEC) 5 MG chewable tablet Chew 5 mg by mouth daily. (Patient not taking: Reported on 04/26/2023)     EPINEPHrine (EPIPEN 2-PAK) 0.3 mg/0.3 mL IJ SOAJ injection Inject  0.3 mg into the muscle once. (Patient not taking: Reported on 04/26/2023)     ibuprofen (ADVIL) 200 MG tablet Take 200 mg by mouth every 6 (six) hours as needed for moderate pain.     nitrofurantoin, macrocrystal-monohydrate, (MACROBID) 100 MG capsule Take by mouth.     Semaglutide,0.25 or 0.5MG /DOS, (OZEMPIC, 0.25 OR 0.5 MG/DOSE,) 2 MG/1.5ML SOPN Inject 0.5 mg into the skin once a week. (Patient not taking: Reported on 04/03/2023)     traMADol (ULTRAM) 50 MG tablet Take 1 tablet (50 mg total) by mouth every 6 (six) hours as needed for moderate pain or severe pain. (Patient not taking: Reported on 04/26/2023) 25 tablet 0   Current Facility-Administered Medications  Medication Dose Route Frequency Provider Last Rate Last Admin   0.9 %  sodium chloride infusion  500 mL Intravenous Continuous Imogene Burn, MD        Allergies as of 04/26/2023 - Review Complete 04/26/2023  Allergen Reaction Noted   Meat [alpha-gal]  04/28/2018    Family History  Problem Relation Age of Onset   Breast cancer Mother 83   CAD Father    Ovarian cancer Maternal Aunt 40       d. 24   Breast cancer Paternal Aunt 42   Lung cancer Paternal Aunt        non-smoker   Stroke Maternal Grandmother    Stroke Maternal Grandfather    Breast cancer Cousin 57       second cancer at 16; Maternal 1st Cousin   Brain cancer Cousin 32       optic nerve tumor   Brain cancer Nephew 11       glioblastoma   Colon cancer Neg Hx    Colon polyps Neg Hx    Esophageal cancer Neg Hx    Rectal cancer Neg Hx    Stomach cancer Neg Hx     Social History   Socioeconomic History   Marital status: Married    Spouse name: Not on file   Number of children: Not on file   Years of education: Not on file   Highest education level: Not on file  Occupational History   Not on file  Tobacco Use   Smoking status: Never   Smokeless tobacco: Never  Vaping Use   Vaping status: Never Used  Substance and Sexual Activity   Alcohol use:  Yes    Comment: rarely   Drug use: No   Sexual activity: Not on file  Other Topics Concern   Not on file  Social History Narrative   Not on file   Social Drivers of Health   Financial Resource Strain: Low Risk  (01/13/2019)   [facility]   Overall Financial Resource Strain (CARDIA)    Difficulty of Paying Living Expenses: Not hard at all  Food Insecurity: No Food Insecurity (10/18/2022)  Hunger Vital Sign    Worried About Running Out of Food in the Last Year: Never true    Ran Out of Food in the Last Year: Never true  Transportation Needs: No Transportation Needs (10/18/2022)   PRAPARE - Administrator, Civil Service (Medical): No    Lack of Transportation (Non-Medical): No  Physical Activity: Not on file  Stress: No Stress Concern Present (01/13/2019)   Received from [facility], [facility] - Occupational Stress Questionnaire    Feeling of Stress : Not at all  Social Connections: Not on file  Intimate Partner Violence: Not At Risk (04/25/2023)   Received from [facility]   Humiliation, Afraid, Rape, and Kick questionnaire    Fear of Current or Ex-Partner: No    Emotionally Abused: No    Physically Abused: No    Sexually Abused: No    Physical Exam: Vital signs in last 24 hours: BP (!) 144/86   Pulse 80   Temp 98.6 F (37 C) (Temporal)   Ht 5\' 3"  (1.6 m)   Wt 220 lb (99.8 kg)   SpO2 97%   BMI 38.97 kg/m  GEN: NAD EYE: Sclerae anicteric ENT: MMM CV: Non-tachycardic Pulm: No increased work of breathing GI: Soft, NT/ND NEURO:  Alert & Oriented   Eulah Pont, MD [facility]  04/26/2023 10:50 AM

## 2023-04-29 ENCOUNTER — Telehealth: Payer: Self-pay

## 2023-04-29 NOTE — Telephone Encounter (Signed)
Attempted f/u call. No answer, left VM. 

## 2023-04-30 ENCOUNTER — Ambulatory Visit: Payer: BC Managed Care – PPO

## 2023-04-30 ENCOUNTER — Encounter: Payer: Self-pay | Admitting: Internal Medicine

## 2023-04-30 ENCOUNTER — Encounter: Payer: Self-pay | Admitting: Adult Health

## 2023-04-30 ENCOUNTER — Inpatient Hospital Stay: Payer: BC Managed Care – PPO | Attending: Adult Health | Admitting: Adult Health

## 2023-04-30 VITALS — BP 112/72 | HR 69 | Temp 98.2°F | Resp 18 | Ht 63.0 in | Wt 229.1 lb

## 2023-04-30 DIAGNOSIS — Z801 Family history of malignant neoplasm of trachea, bronchus and lung: Secondary | ICD-10-CM | POA: Diagnosis not present

## 2023-04-30 DIAGNOSIS — Z1721 Progesterone receptor positive status: Secondary | ICD-10-CM | POA: Diagnosis not present

## 2023-04-30 DIAGNOSIS — Z823 Family history of stroke: Secondary | ICD-10-CM | POA: Diagnosis not present

## 2023-04-30 DIAGNOSIS — Z8249 Family history of ischemic heart disease and other diseases of the circulatory system: Secondary | ICD-10-CM | POA: Diagnosis not present

## 2023-04-30 DIAGNOSIS — Z923 Personal history of irradiation: Secondary | ICD-10-CM | POA: Diagnosis not present

## 2023-04-30 DIAGNOSIS — I890 Lymphedema, not elsewhere classified: Secondary | ICD-10-CM

## 2023-04-30 DIAGNOSIS — Z1732 Human epidermal growth factor receptor 2 negative status: Secondary | ICD-10-CM | POA: Insufficient documentation

## 2023-04-30 DIAGNOSIS — Z79899 Other long term (current) drug therapy: Secondary | ICD-10-CM | POA: Insufficient documentation

## 2023-04-30 DIAGNOSIS — Z483 Aftercare following surgery for neoplasm: Secondary | ICD-10-CM

## 2023-04-30 DIAGNOSIS — Z6841 Body Mass Index (BMI) 40.0 and over, adult: Secondary | ICD-10-CM | POA: Diagnosis not present

## 2023-04-30 DIAGNOSIS — R232 Flushing: Secondary | ICD-10-CM | POA: Insufficient documentation

## 2023-04-30 DIAGNOSIS — Z808 Family history of malignant neoplasm of other organs or systems: Secondary | ICD-10-CM | POA: Insufficient documentation

## 2023-04-30 DIAGNOSIS — J45909 Unspecified asthma, uncomplicated: Secondary | ICD-10-CM | POA: Diagnosis not present

## 2023-04-30 DIAGNOSIS — Z79810 Long term (current) use of selective estrogen receptor modulators (SERMs): Secondary | ICD-10-CM | POA: Insufficient documentation

## 2023-04-30 DIAGNOSIS — Z9049 Acquired absence of other specified parts of digestive tract: Secondary | ICD-10-CM | POA: Diagnosis not present

## 2023-04-30 DIAGNOSIS — N63 Unspecified lump in unspecified breast: Secondary | ICD-10-CM

## 2023-04-30 DIAGNOSIS — Z170 Estrogen receptor positive status [ER+]: Secondary | ICD-10-CM

## 2023-04-30 DIAGNOSIS — C50211 Malignant neoplasm of upper-inner quadrant of right female breast: Secondary | ICD-10-CM | POA: Insufficient documentation

## 2023-04-30 DIAGNOSIS — Z803 Family history of malignant neoplasm of breast: Secondary | ICD-10-CM | POA: Insufficient documentation

## 2023-04-30 DIAGNOSIS — R600 Localized edema: Secondary | ICD-10-CM

## 2023-04-30 DIAGNOSIS — Z8041 Family history of malignant neoplasm of ovary: Secondary | ICD-10-CM | POA: Diagnosis not present

## 2023-04-30 LAB — SURGICAL PATHOLOGY

## 2023-04-30 NOTE — Progress Notes (Unsigned)
SURVIVORSHIP VISIT:  BRIEF ONCOLOGIC HISTORY:  Oncology History  Malignant neoplasm of upper-inner quadrant of right breast in female, estrogen receptor positive (HCC)  08/07/2022 Initial Diagnosis   Mammogram detected suspicious mass in the right breast 12:30 position 1.1 cm (no evidence of malignancy in the area of concern in the LIQ) biopsy: Grade 2 IDC ER 95%, PR 100%, Ki67 10%, HER2 2+ by IHC negative by FISH ratio 1.09, copy #1.8   09/04/2022 Cancer Staging   Staging form: Breast, AJCC 8th Edition - Clinical: Stage IA (cT1c, cN0, cM0, G2, ER+, PR+, HER2-) - Signed by Serena Croissant, MD on 09/04/2022 Stage prefix: Initial diagnosis Histologic grading system: 3 grade system   09/06/2022 Surgery   Right lumpectomy: Grade 2 IDC 1.5 cm margins -0/7 lymph nodes negative, ER 95%, PR 100%, HER2 negative, Ki-67 10%   09/18/2022 Oncotype testing   Oncotype DX recurrence score 20 (risk of distant) and 9 years: 6%) no benefit of chemo   09/23/2022 Genetic Testing   Negative genetic testing on the Multi-cancer+RNAinsight panel.  The report date is Sep 23, 2022.  The Multi-Cancer + RNA Panel offered by [facility] includes sequencing and/or deletion/duplication analysis of the following 70 genes:  AIP*, ALK, APC*, ATM*, AXIN2*, BAP1*, BARD1*, BLM*, BMPR1A*, BRCA1*, BRCA2*, BRIP1*, CDC73*, CDH1*, CDK4, CDKN1B*, CDKN2A, CHEK2*, CTNNA1*, DICER1*, EPCAM (del/dup only), EGFR, FH*, FLCN*, GREM1 (promoter dup only), HOXB13, KIT, LZTR1, MAX*, MBD4, MEN1*, MET, MITF, MLH1*, MSH2*, MSH3*, MSH6*, MUTYH*, NF1*, NF2*, NTHL1*, PALB2*, PDGFRA, PMS2*, POLD1*, POLE*, POT1*, PRKAR1A*, PTCH1*, PTEN*, RAD51C*, RAD51D*, RB1*, RET, SDHA* (sequencing only), SDHAF2*, SDHB*, SDHC*, SDHD*, SMAD4*, SMARCA4*, SMARCB1*, SMARCE1*, STK11*, SUFU*, TMEM127*, TP53*, TSC1*, TSC2*, VHL*. RNA analysis is performed for * genes.   11/06/2022 - 12/04/2022 Radiation Therapy   Plan Name: Breast_R Site: Breast, Right Technique: 3D Mode: Photon Dose  Per Fraction: 1.8 Gy Prescribed Dose (Delivered / Prescribed): 1.8 Gy / 50.4 Gy Prescribed Fxs (Delivered / Prescribed): 1 / 28   Plan Name: Breast_R_Bst1 Site: Breast, Right Technique: 3D Mode: Photon Dose Per Fraction: 2.66 Gy Prescribed Dose (Delivered / Prescribed): 39.9 Gy / 39.9 Gy Prescribed Fxs (Delivered / Prescribed): 15 / 15   Plan Name: Breast_R_Bst2 Site: Breast, Right Technique: 3D Mode: Photon Dose Per Fraction: 2.5 Gy Prescribed Dose (Delivered / Prescribed): 10 Gy / 10 Gy Prescribed Fxs (Delivered / Prescribed): 4 / 4   12/2022 -  Anti-estrogen oral therapy   Tamoxifen     INTERVAL HISTORY:  Ms. Nason to review her survivorship care plan detailing her treatment course for breast cancer, as well as monitoring long-term side effects of that treatment, education regarding health maintenance, screening, and overall wellness and health promotion.     Overall, Ms. Barberena reports feeling quite well.  She is taking Tamoxifen daily. She reports having had five UTIs since commencing the medication. The patient has been prescribed Macrobid, which seems to be helping, by her gynecologist. She also reports a cessation of her menstrual cycle since October and experiencing hot flashes, but no other significant side effects from the tamoxifen.  In addition to the UTIs, the patient has been experiencing fullness and pain in the right breast, where she previously had a lumpectomy and radiation therapy. The patient also mentions having uterine fibroids and a cyst on her ovary, for which she is due to have a follow-up vaginal ultrasound.  The patient maintains a healthy lifestyle, primarily drinking water and exercising regularly  REVIEW OF SYSTEMS:  Review of Systems  Constitutional:  Negative  for appetite change, chills, fatigue, fever and unexpected weight change.  HENT:   Negative for hearing loss, lump/mass and trouble swallowing.   Eyes:  Negative for eye problems and  icterus.  Respiratory:  Negative for chest tightness, cough and shortness of breath.   Cardiovascular:  Negative for chest pain, leg swelling and palpitations.  Gastrointestinal:  Negative for abdominal distention, abdominal pain, constipation, diarrhea, nausea and vomiting.  Endocrine: Negative for hot flashes.  Genitourinary:  Negative for difficulty urinating.   Musculoskeletal:  Negative for arthralgias.  Skin:  Negative for itching and rash.  Neurological:  Negative for dizziness, extremity weakness, headaches and numbness.  Hematological:  Negative for adenopathy. Does not bruise/bleed easily.  Psychiatric/Behavioral:  Negative for depression. The patient is not nervous/anxious.    Breast: Denies any new nodularity, masses, tenderness, nipple changes, or nipple discharge.       PAST MEDICAL/SURGICAL HISTORY:  Past Medical History:  Diagnosis Date   Anxiety    Asthma    Breast cancer (HCC) 08/07/2022   Diabetes (HCC)    Dysrhythmia    Hx of Irregular Heartbeat   Family history of brain cancer    Family history of breast cancer    Family history of ovarian cancer    GERD (gastroesophageal reflux disease)    History of panic attacks    History of tachycardia    Details not clear, reported abnormal tilt table test at age 76, on chronic low-dose beta-blocker   PONV (postoperative nausea and vomiting)    Past Surgical History:  Procedure Laterality Date   BREAST BIOPSY Right 08/07/2022   [place] RT BREAST BX W LOC DEV 1ST LESION IMG BX SPEC US GUIDE 08/07/2022 [facility]   BREAST BIOPSY  09/04/2022   [place] RT RADIOACTIVE SEED LOC 09/04/2022 [facility]   BREAST LUMPECTOMY WITH RADIOACTIVE SEED AND SENTINEL LYMPH NODE BIOPSY Right 09/06/2022   Procedure: RIGHT BREAST LUMPECTOMY WITH RADIOACTIVE SEED AND SENTINEL LYMPH NODE BIOPSY;  Surgeon: Abigail Miyamoto, MD;  Location: [facility] OR;  Service: General;  Laterality: Right;   CHOLECYSTECTOMY N/A 05/01/2018   Procedure:  LAPAROSCOPIC CHOLECYSTECTOMY;  Surgeon: Rodman Pickle, MD;  Location: [facility];  Service: General;  Laterality: N/A;   KNEE ARTHROSCOPY  1989   No previous surgeries     WISDOM TOOTH EXTRACTION       ALLERGIES:  Allergies  Allergen Reactions   Meat [Alpha-Gal]     Unknown reaction      CURRENT MEDICATIONS:  Outpatient Encounter Medications as of 04/30/2023  Medication Sig Note   escitalopram (LEXAPRO) 10 MG tablet Take 5 mg by mouth at bedtime.    nitrofurantoin, macrocrystal-monohydrate, (MACROBID) 100 MG capsule Take by mouth. 04/26/2023: Start today   tamoxifen (NOLVADEX) 20 MG tablet Take 1 tablet (20 mg total) by mouth daily.    ALPRAZolam (XANAX) 0.25 MG tablet Take by mouth. (Patient not taking: Reported on 04/30/2023)    cetirizine (ZYRTEC) 5 MG chewable tablet Chew 5 mg by mouth daily. (Patient not taking: Reported on 04/03/2023)    EPINEPHrine (EPIPEN 2-PAK) 0.3 mg/0.3 mL IJ SOAJ injection Inject 0.3 mg into the muscle once. (Patient not taking: Reported on 04/26/2023)    ibuprofen (ADVIL) 200 MG tablet Take 200 mg by mouth every 6 (six) hours as needed for moderate pain. (Patient not taking: Reported on 04/30/2023)    Semaglutide,0.25 or 0.5MG /DOS, (OZEMPIC, 0.25 OR 0.5 MG/DOSE,) 2 MG/1.5ML SOPN Inject 0.5 mg into the skin once a week. (Patient not taking:  Reported on 04/03/2023)    [DISCONTINUED] traMADol (ULTRAM) 50 MG tablet Take 1 tablet (50 mg total) by mouth every 6 (six) hours as needed for moderate pain or severe pain. (Patient not taking: Reported on 10/18/2022)    No facility-administered encounter medications on file as of 04/30/2023.     ONCOLOGIC FAMILY HISTORY:  Family History  Problem Relation Age of Onset   Breast cancer Mother 46   CAD Father    Ovarian cancer Maternal Aunt 40       d. 59   Breast cancer Paternal Aunt 40   Lung cancer Paternal Aunt        non-smoker   Stroke Maternal Grandmother    Stroke Maternal Grandfather    Breast  cancer Cousin 37       second cancer at 65; Maternal 1st Cousin   Brain cancer Cousin 32       optic nerve tumor   Brain cancer Nephew 11       glioblastoma   Colon cancer Neg Hx    Colon polyps Neg Hx    Esophageal cancer Neg Hx    Rectal cancer Neg Hx    Stomach cancer Neg Hx      SOCIAL HISTORY:  Social History   Socioeconomic History   Marital status: Married    Spouse name: Not on file   Number of children: Not on file   Years of education: Not on file   Highest education level: Not on file  Occupational History   Not on file  Tobacco Use   Smoking status: Never   Smokeless tobacco: Never  Vaping Use   Vaping status: Never Used  Substance and Sexual Activity   Alcohol use: Yes    Comment: rarely   Drug use: No   Sexual activity: Not on file  Other Topics Concern   Not on file  Social History Narrative   Not on file   Social Drivers of Health   Financial Resource Strain: Low Risk  (01/13/2019)   [facility]   Overall Financial Resource Strain (CARDIA)    Difficulty of Paying Living Expenses: Not hard at all  Food Insecurity: No Food Insecurity (10/18/2022)   Hunger Vital Sign    Worried About Running Out of Food in the Last Year: Never true    Ran Out of Food in the Last Year: Never true  Transportation Needs: No Transportation Needs (10/18/2022)   PRAPARE - Administrator, Civil Service (Medical): No    Lack of Transportation (Non-Medical): No  Physical Activity: Not on file  Stress: No Stress Concern Present (01/13/2019)   Received from [facility], [facility] - Occupational Stress Questionnaire    Feeling of Stress : Not at all  Social Connections: Not on file  Intimate Partner Violence: Not At Risk (04/25/2023)   Received from [facility]   Humiliation, Afraid, Rape, and Kick questionnaire    Fear of Current or Ex-Partner: No    Emotionally Abused: No     Physically Abused: No    Sexually Abused: No     OBSERVATIONS/OBJECTIVE:  BP 112/72 (BP Location: Left Arm, Patient Position: Sitting)   Pulse 69   Temp 98.2 F (36.8 C) (Tympanic)   Resp 18   Ht 5\' 3"  (1.6 m)   Wt 229 lb 1.6 oz (103.9 kg)   SpO2 99%   BMI  40.58 kg/m  GENERAL: Patient is a well appearing female in no acute distress HEENT:  Sclerae anicteric.  Oropharynx clear and moist. No ulcerations or evidence of oropharyngeal candidiasis. Neck is supple.  NODES:  No cervical, supraclavicular, or axillary lymphadenopathy palpated.  BREAST EXAM: right breast s/p lumpectomy and radiation, no sign of local recurrence, left breast benign LUNGS:  Clear to auscultation bilaterally.  No wheezes or rhonchi. HEART:  Regular rate and rhythm. No murmur appreciated. ABDOMEN:  Soft, nontender.  Positive, normoactive bowel sounds. No organomegaly palpated. MSK:  No focal spinal tenderness to palpation. Full range of motion bilaterally in the upper extremities. EXTREMITIES:  No peripheral edema.   SKIN:  Clear with no obvious rashes or skin changes. No nail dyscrasia. NEURO:  Nonfocal. Well oriented.  Appropriate affect.   LABORATORY DATA:  None for this visit.  DIAGNOSTIC IMAGING:  None for this visit.      ASSESSMENT AND PLAN:  Ms.. Shumard is a pleasant 47 y.o. female with Stage IA right breast invasive ductal carcinoma, ER+/PR+/HER2-, diagnosed in 07/2022, treated with lumpectomy, adjuvant radiation therapy, and anti-estrogen therapy with Tamoxifen beginning in 12/2022.  She presents to the Survivorship Clinic for our initial meeting and routine follow-up post-completion of treatment for breast cancer.    1. Stage IA right breast cancer:  Ms. Westover is continuing to recover from definitive treatment for breast cancer. She will follow-up with her medical oncologist, Dr. Pamelia Hoit in 6 months time with history and physical exam per surveillance protocol.  She will continue her  anti-estrogen therapy with Tamoxifen.  She is having increased UTI's and we discussed this is also a possibility with aromatase inhibitors + Zoladex, so she would rather stay with Tamoxifen.  If UTI's are persistent I suggested urology evaluation. Her mammogram is due 07/2023; orders placed today.   Today, a comprehensive survivorship care plan and treatment summary was reviewed with the patient today detailing her breast cancer diagnosis, treatment course, potential late/long-term effects of treatment, appropriate follow-up care with recommendations for the future, and patient education resources.  A copy of this summary, along with a letter will be sent to the patient's primary care provider via mail/fax/In Basket message after today's visit.    2. Bone health:   She was given education on specific activities to promote bone health.  3. Cancer screening:  Due to Ms. Geng's history and her age, she should receive screening for skin cancers, colon cancer, and gynecologic cancers.  The information and recommendations are listed on the patient's comprehensive care plan/treatment summary and were reviewed in detail with the patient.    4. Health maintenance and wellness promotion: Ms. Jasso was encouraged to consume 5-7 servings of fruits and vegetables per day. We reviewed the "Nutrition Rainbow" handout.  She was also encouraged to engage in moderate to vigorous exercise for 30 minutes per day most days of the week.  She was instructed to limit her alcohol consumption and continue to abstain from tobacco use.     5. Support services/counseling: It is not uncommon for this period of the patient's cancer care trajectory to be one of many emotions and stressors.   She was given information regarding our available services and encouraged to contact me with any questions or for help enrolling in any of our support group/programs.    Follow up instructions:    -Return to cancer center in 6 months for f/u  with Dr. Pamelia Hoit   -Mammogram due in 07/2023 -She is welcome  to return back to the Survivorship Clinic at any time; no additional follow-up needed at this time.  -Consider referral back to survivorship as a long-term survivor for continued surveillance  The patient was provided an opportunity to ask questions and all were answered. The patient agreed with the plan and demonstrated an understanding of the instructions.   Total encounter time:40 minutes*in face-to-face visit time, chart review, lab review, care coordination, order entry, and documentation of the encounter time.    Lillard Anes, NP 04/30/23 2:20 PM Medical Oncology and Hematology [facility] [address] [place], [place] [address] Tel. [phone]    Fax. [phone]  *Total Encounter Time as defined by the Centers for Medicare and Medicaid Services includes, in addition to the face-to-face time of a patient visit (documented in the note above) non-face-to-face time: obtaining and reviewing outside history, ordering and reviewing medications, tests or procedures, care coordination (communications with other health care professionals or caregivers) and documentation in the medical record.

## 2023-04-30 NOTE — Therapy (Signed)
OUTPATIENT PHYSICAL THERAPY  UPPER EXTREMITY ONCOLOGY TREATMENT  Patient Name: Jennifer Kane MRN: [id number] DOB:07/17/1975, 47 y.o., female Today's Date: 04/30/2023  END OF SESSION:  PT End of Session - 04/30/23 1514     Visit Number 3    Number of Visits 12    Date for PT Re-Evaluation 05/28/23    PT Start Time 1514   late from cancer ctr   PT Stop Time 1552    PT Time Calculation (min) 38 min    Activity Tolerance Patient tolerated treatment well             Past Medical History:  Diagnosis Date   Anxiety    Asthma    Breast cancer (HCC) 08/07/2022   Diabetes (HCC)    Dysrhythmia    Hx of Irregular Heartbeat   Family history of brain cancer    Family history of breast cancer    Family history of ovarian cancer    GERD (gastroesophageal reflux disease)    History of panic attacks    History of tachycardia    Details not clear, reported abnormal tilt table test at age 89, on chronic low-dose beta-blocker   PONV (postoperative nausea and vomiting)    Past Surgical History:  Procedure Laterality Date   BREAST BIOPSY Right 08/07/2022   [place] RT BREAST BX W LOC DEV 1ST LESION IMG BX SPEC US GUIDE 08/07/2022 [facility]   BREAST BIOPSY  09/04/2022   [place] RT RADIOACTIVE SEED LOC 09/04/2022 [facility]   BREAST LUMPECTOMY WITH RADIOACTIVE SEED AND SENTINEL LYMPH NODE BIOPSY Right 09/06/2022   Procedure: RIGHT BREAST LUMPECTOMY WITH RADIOACTIVE SEED AND SENTINEL LYMPH NODE BIOPSY;  Surgeon: Abigail Miyamoto, MD;  Location: [facility];  Service: General;  Laterality: Right;   CHOLECYSTECTOMY N/A 05/01/2018   Procedure: LAPAROSCOPIC CHOLECYSTECTOMY;  Surgeon: Rodman Pickle, MD;  Location: [facility];  Service: General;  Laterality: N/A;   KNEE ARTHROSCOPY  1989   No previous surgeries     WISDOM TOOTH EXTRACTION     Patient Active Problem List   Diagnosis Date Noted   Genetic testing 09/24/2022   Family history of breast cancer 09/12/2022   Family history of  ovarian cancer 09/12/2022   Family history of brain cancer 09/12/2022   Malignant neoplasm of upper-inner quadrant of right breast in female, estrogen receptor positive (HCC) 08/24/2022   Moderate persistent asthma, uncomplicated 01/31/2022   Seasonal and perennial allergic rhinitis 01/31/2022   Anaphylactic shock due to adverse food reaction 01/31/2022     REFERRING PROVIDER: Serena Croissant, MD  REFERRING DIAG: right breast Lymphedema s/p Lumpectomy  THERAPY DIAG:  Malignant neoplasm of upper-inner quadrant of right breast in female, estrogen receptor positive (HCC)  Aftercare following surgery for neoplasm  Localized edema  Breast swelling  Lymphedema, not elsewhere classified  ONSET DATE: 12/2022  Rationale for Evaluation and Treatment: Rehabilitation  SUBJECTIVE:  SUBJECTIVE STATEMENT:  Pt was running late from [facility].. She tried the MLD several times but isn't sure she was doing it right.   PERTINENT HISTORY:  Patient was diagnosed with right grade 2 IDC. It measures 1.1 cm. It is ER/PR positive with a Ki67 of 10%. Rt breast lumpectomy and SLNB on 09/06/22 with 7 negative nodes removed No benefit to chemotherapy and pt had radiation and antiestrogen therapy. Other hx includes asthma, DM, Right shoulder pain from cross fit  PAIN:  Are you having pain? Yes NPRS scale: 1/10  tender today at right arm pit Pain location: Right breast Pain orientation: Right and Lateral  PAIN TYPE: aching and tight Pain description: constant  Aggravating factors: moving arm, laying down, laying on stomach Relieving factors: not sure  PRECAUTIONS: Right UE Lymphedema risk, DM, Asthma, prior injury to right shoulder doing crossfit  RED FLAGS: None   WEIGHT BEARING RESTRICTIONS: No  FALLS:  Has  patient fallen in last 6 months? No  LIVING ENVIRONMENT: Lives with: lives with their spouse   OCCUPATION: kindergarten teacher   LEISURE: nothing right now  HAND DOMINANCE: right   PRIOR LEVEL OF FUNCTION: Independent  PATIENT GOALS: To decrease right breast swelling, discomfort   OBJECTIVE: Note: Objective measures were completed at Evaluation unless otherwise noted.  COGNITION: Overall cognitive status: Within functional limits for tasks assessed   PALPATION: Tender right pectorals, inferior axilla, lateral arm at mid delt  OBSERVATIONS / OTHER ASSESSMENTS: generalized right breast swelling and axillary swelling, mild peau d'orange inferior medial breast, New burn on right breast from spilling [place] oil on Thanksgiving, mild redness right breast. Fibrosis noted in axilla, and medial breast  SENSATION: Light touch: Deficits    POSTURE: Forward head, rounded shoulders  UPPER EXTREMITY AROM/PROM:  A/PROM RIGHT   eval   Shoulder extension 50, tight outer arm  Shoulder flexion 160 tight pecs  Shoulder abduction 170  Shoulder internal rotation 60 pain  Shoulder external rotation NT    (Blank rows = not tested)  A/PROM LEFT   eval  Shoulder extension 43  Shoulder flexion 167  Shoulder abduction 177  Shoulder internal rotation 60  Shoulder external rotation 100    (Blank rows = not tested)  CERVICAL AROM: All within functional limits:      UPPER EXTREMITY STRENGTH:   LYMPHEDEMA ASSESSMENTS:   SURGERY TYPE/DATE: Rt breast lumpectomy and SLNB on 09/06/22   NUMBER OF LYMPH NODES REMOVED: 0/7  CHEMOTHERAPY: No  RADIATION:YES, 11/06/2022-12/04/2022  HORMONE TREATMENT: YES, Tamoxifen  INFECTIONS: NO   LYMPHEDEMA ASSESSMENTS:   LANDMARK RIGHT  eval  At axilla  39.7  15 cm proximal to olecranon process   10 cm proximal to olecranon process 36.5  Olecranon process 30.3  15 cm proximal to ulnar styloid process 30.5  10 cm proximal to ulnar styloid  process 28.2  Just proximal to ulnar styloid process 19.7  Across hand at thumb web space 21.4  At base of 2nd digit 7.2  (Blank rows = not tested)  LANDMARK LEFT  eval  At axilla  39.9  15 cm proximal to olecranon process   10 cm proximal to olecranon process 36.3  Olecranon process 29.8  15 cm proximal to ulnar styloid process 28.8  10 cm proximal to ulnar styloid process 27.0  Just proximal to ulnar styloid process 19.8  Across hand at thumb web space 20.1  At base of 2nd digit 7  (Blank rows = not tested)   FUNCTIONAL  TESTS:    GAIT: WNL  QUICK DASH SURVEY: 11.36  BREAST COMPLAINTS QUESTIONNAIRE Pain:4 Heaviness:3 Swollen feeling:3 Tense Skin:3 Redness:2 Bra Print:4 Size of Pores:1 Hard feeling: 5 Total:   25  /80 A Score over 9 indicates lymphedema issues in the breast    TODAY'S TREATMENT:                                                                                                                                          DATE:   04/30/2023  Foam pad made for axillary border of bra to prevent bra digging in and causing more swelling in axilla Fibrosis noted right axillary area and lateral breast, and mildly at medial breast continued MLD to right breast while instructing pt and having her try all techniques. In supine: Short neck, 5 diaphragmatic breaths, L axillary nodes and establishment of interaxillary pathway, R inguinal nodes and establishment of axilloinguinal pathway, then R breast moving fluid towards pathways spending extra time in any areas of fibrosis then retracing all steps and ending with LN's. Showed fibrosis technique prior to MLD Therapist demonstrated and pt practiced all techniques.  04/22/2023 STM with cocoabutter to right UT, pectorals and lateral trunk Supine wand flexion x 3, scaption x 2 complained of shoulder pain so held Tried PROM but pt too guarded to allow me to perform PROM Initiated lower trunk rotations with knees left x  3 to stretch right pectorals. Initiated MLD to right breast while instructing pt and having her try all techniques. In supine: Short neck, 5 diaphragmatic breaths, L axillary nodes and establishment of interaxillary pathway, R inguinal nodes and establishment of axilloinguinal pathway, then R breast moving fluid towards pathways spending extra time in any areas of fibrosis then retracing all steps and ending with LN's Therapist demonstrated and pt practiced all. She required VC's and TC's initially, but improved with practice and used good pressure. She had some difficulty getting her hand flat while doing the medial side of her breast. Gave pt written handout for MLD. Reminded to continue stretches to get pecs loosened up.     04/16/2023 Pt educated in supine stargazer stretch and abduction wall stretch to stretch tight muscles and to perform 2x/day holding 5-10 secs. Showed chart of lymphatics and explained the gentle nature of MLD that we will be doing for breast swelling due to superficial nature of lymphatics.    PATIENT EDUCATION:  Education details: Editor, commissioning, wall slides for abd, Role of lymphatics Person educated: Patient Education method: Explanation and Handouts Education comprehension: verbalized understanding and returned demonstration  HOME EXERCISE PROGRAM: Stargazer stretch, wall slide for abd  ASSESSMENT:  CLINICAL IMPRESSION: Continued pt instruction for left breast MLD. Pt did exceptionally well return demonstrating sequence and techniques today with only occasional VC's and TC's.  Pts fibrosis was much softer at completion of MLD. OBJECTIVE IMPAIRMENTS: decreased activity tolerance, decreased knowledge of condition,  decreased ROM, increased edema, impaired flexibility, impaired sensation, postural dysfunction, and pain.   ACTIVITY LIMITATIONS: lifting, sleeping, bed mobility, reach over head, and hygiene/grooming  PARTICIPATION LIMITATIONS: cleaning and extremes of  reaching  PERSONAL FACTORS: 1-2 comorbidities: Right breast Cancer s/p Lumpectomy and radiation  are also affecting patient's functional outcome.   REHAB POTENTIAL: Good  CLINICAL DECISION MAKING: Stable/uncomplicated  EVALUATION COMPLEXITY: Low  GOALS: Goals reviewed with patient? Yes  SHORT TERM GOALS=LONG TERM GOALS: Target date: 05/27/2022  Pt will be independent with use of compression bra/sports bra to decrease swelling Baseline: Goal status: INITIAL  2.  Breast Complaints survey will be no greater than 10  to demonstrate improved swelling Baseline: 25 Goal status: INITIAL  3.  Pt will be independent in MLD to the right breast to decrease swelling. Baseline:  Goal status: INITIAL  4.  Pt will have no complaints of pectoral/lateral trunk tightness with right shoulder ROM Baseline:  Goal status: INITIAL  5.  Pt will report decreased swelling by atleast 50% Baseline:  Goal status: INITIAL  6.  Pt will have greater than 50% improvement in Right UQ pain Baseline:  Goal status: INITIAL   PLAN:  PT FREQUENCY: 2x/week  PT DURATION: 6 weeks  PLANNED INTERVENTIONS: 16109- PT Re-evaluation, 97110-Therapeutic exercises, 97112- Neuromuscular re-education, 97535- Self Care, 60454- Manual therapy, 97760- Orthotic Fit/training, Patient/Family education, Joint mobilization, Manual lymph drainage, Scar mobilization, Therapeutic exercises, Neuromuscular re-education, and Self Care  PLAN FOR NEXT SESSION: STM right pectorals, lateral trunk, UT,  AAROM and AROM (assess shoulder pain) review MLD to right breast and continue instructing pt. Give pics of LTR and doorway stretch.  Waynette Buttery, PT 04/30/2023, 3:53 PM

## 2023-05-07 ENCOUNTER — Ambulatory Visit: Payer: BC Managed Care – PPO

## 2023-05-07 ENCOUNTER — Telehealth: Payer: Self-pay

## 2023-05-07 NOTE — Telephone Encounter (Signed)
Called pt due to no show. Pt said she cancelled on the message she was sent by [place]. Advised her that we do not get those cancellations and that she would need to cancel on my chart or call us. She has an appt scheduled for next week.

## 2023-05-13 ENCOUNTER — Ambulatory Visit: Payer: BC Managed Care – PPO

## 2023-05-21 ENCOUNTER — Encounter: Payer: Self-pay | Admitting: Rehabilitation

## 2023-05-21 ENCOUNTER — Ambulatory Visit: Payer: 59 | Attending: Surgery | Admitting: Rehabilitation

## 2023-05-21 DIAGNOSIS — N63 Unspecified lump in unspecified breast: Secondary | ICD-10-CM

## 2023-05-21 DIAGNOSIS — Z9189 Other specified personal risk factors, not elsewhere classified: Secondary | ICD-10-CM | POA: Diagnosis present

## 2023-05-21 DIAGNOSIS — C50211 Malignant neoplasm of upper-inner quadrant of right female breast: Secondary | ICD-10-CM

## 2023-05-21 DIAGNOSIS — I890 Lymphedema, not elsewhere classified: Secondary | ICD-10-CM

## 2023-05-21 DIAGNOSIS — Z483 Aftercare following surgery for neoplasm: Secondary | ICD-10-CM | POA: Diagnosis present

## 2023-05-21 DIAGNOSIS — R293 Abnormal posture: Secondary | ICD-10-CM

## 2023-05-21 DIAGNOSIS — Z170 Estrogen receptor positive status [ER+]: Secondary | ICD-10-CM | POA: Diagnosis present

## 2023-05-21 DIAGNOSIS — R600 Localized edema: Secondary | ICD-10-CM | POA: Diagnosis present

## 2023-05-21 NOTE — Therapy (Signed)
 OUTPATIENT PHYSICAL THERAPY  UPPER EXTREMITY ONCOLOGY TREATMENT  Patient Name: TENEIL SHILLER MRN: [id number] DOB:07/14/1975, 48 y.o., female Today's Date: 05/21/2023  END OF SESSION:  PT End of Session - 05/21/23 1556     Visit Number 4    Number of Visits 12    Date for PT Re-Evaluation 05/28/23    PT Start Time 1600    PT Stop Time 1653    PT Time Calculation (min) 53 min    Activity Tolerance Patient tolerated treatment well    Behavior During Therapy [facility] for tasks assessed/performed             Past Medical History:  Diagnosis Date   Anxiety    Asthma    Breast cancer (HCC) 08/07/2022   Diabetes (HCC)    Dysrhythmia    Hx of Irregular Heartbeat   Family history of brain cancer    Family history of breast cancer    Family history of ovarian cancer    GERD (gastroesophageal reflux disease)    History of panic attacks    History of tachycardia    Details not clear, reported abnormal tilt table test at age 89, on chronic low-dose beta-blocker   PONV (postoperative nausea and vomiting)    Past Surgical History:  Procedure Laterality Date   BREAST BIOPSY Right 08/07/2022   US  RT BREAST BX W LOC DEV 1ST LESION IMG BX SPEC US  GUIDE 08/07/2022 [facility]   BREAST BIOPSY  09/04/2022   US  RT RADIOACTIVE SEED LOC 09/04/2022 [facility]   BREAST LUMPECTOMY WITH RADIOACTIVE SEED AND SENTINEL LYMPH NODE BIOPSY Right 09/06/2022   Procedure: RIGHT BREAST LUMPECTOMY WITH RADIOACTIVE SEED AND SENTINEL LYMPH NODE BIOPSY;  Surgeon: Vernetta Berg, MD;  Location: [facility];  Service: General;  Laterality: Right;   CHOLECYSTECTOMY N/A 05/01/2018   Procedure: LAPAROSCOPIC CHOLECYSTECTOMY;  Surgeon: Stevie Herlene Righter, MD;  Location: [facility];  Service: General;  Laterality: N/A;   KNEE ARTHROSCOPY  1989   No previous surgeries     WISDOM TOOTH EXTRACTION     Patient Active Problem List   Diagnosis Date Noted   Genetic testing 09/24/2022   Family history of breast  cancer 09/12/2022   Family history of ovarian cancer 09/12/2022   Family history of brain cancer 09/12/2022   Malignant neoplasm of upper-inner quadrant of right breast in female, estrogen receptor positive (HCC) 08/24/2022   Moderate persistent asthma, uncomplicated 01/31/2022   Seasonal and perennial allergic rhinitis 01/31/2022   Anaphylactic shock due to adverse food reaction 01/31/2022     REFERRING PROVIDER: Mackey Chad, MD  REFERRING DIAG: right breast Lymphedema s/p Lumpectomy  THERAPY DIAG:  Malignant neoplasm of upper-inner quadrant of right breast in female, estrogen receptor positive (HCC)  Aftercare following surgery for neoplasm  Localized edema  Breast swelling  At risk for lymphedema  Abnormal posture  Lymphedema, not elsewhere classified  ONSET DATE: 12/2022  Rationale for Evaluation and Treatment: Rehabilitation  SUBJECTIVE:  SUBJECTIVE STATEMENT:  It is feeling good lately.   PERTINENT HISTORY:  Patient was diagnosed with right grade 2 IDC. It measures 1.1 cm. It is ER/PR positive with a Ki67 of 10%. Rt breast lumpectomy and SLNB on 09/06/22 with 7 negative nodes removed No benefit to chemotherapy and pt had radiation and antiestrogen therapy. Other hx includes asthma, DM, Right shoulder pain from cross fit  PAIN:  Are you having pain? No  PRECAUTIONS: Right UE Lymphedema risk, DM, Asthma, prior injury to right shoulder doing crossfit  RED FLAGS: None   WEIGHT BEARING RESTRICTIONS: No  FALLS:  Has patient fallen in last 6 months? No  LIVING ENVIRONMENT: Lives with: lives with their spouse   OCCUPATION: kindergarten teacher   LEISURE: nothing right now  HAND DOMINANCE: right   PRIOR LEVEL OF FUNCTION: Independent  PATIENT GOALS: To decrease right breast  swelling, discomfort   OBJECTIVE: Note: Objective measures were completed at Evaluation unless otherwise noted.  COGNITION: Overall cognitive status: Within functional limits for tasks assessed   PALPATION: Tender right pectorals, inferior axilla, lateral arm at mid delt  OBSERVATIONS / OTHER ASSESSMENTS: generalized right breast swelling and axillary swelling, mild peau d'orange inferior medial breast, New burn on right breast from spilling turkey oil on Thanksgiving, mild redness right breast. Fibrosis noted in axilla, and medial breast  SENSATION: Light touch: Deficits    POSTURE: Forward head, rounded shoulders  UPPER EXTREMITY AROM/PROM:  A/PROM RIGHT   eval   Shoulder extension 50, tight outer arm  Shoulder flexion 160 tight pecs  Shoulder abduction 170  Shoulder internal rotation 60 pain  Shoulder external rotation NT    (Blank rows = not tested)  A/PROM LEFT   eval  Shoulder extension 43  Shoulder flexion 167  Shoulder abduction 177  Shoulder internal rotation 60  Shoulder external rotation 100    (Blank rows = not tested)  CERVICAL AROM: All within functional limits:      UPPER EXTREMITY STRENGTH:   LYMPHEDEMA ASSESSMENTS:   SURGERY TYPE/DATE: Rt breast lumpectomy and SLNB on 09/06/22   NUMBER OF LYMPH NODES REMOVED: 0/7  CHEMOTHERAPY: No  RADIATION:YES, 11/06/2022-12/04/2022  HORMONE TREATMENT: YES, Tamoxifen   INFECTIONS: NO   LYMPHEDEMA ASSESSMENTS:   LANDMARK RIGHT  eval  At axilla  39.7  15 cm proximal to olecranon process   10 cm proximal to olecranon process 36.5  Olecranon process 30.3  15 cm proximal to ulnar styloid process 30.5  10 cm proximal to ulnar styloid process 28.2  Just proximal to ulnar styloid process 19.7  Across hand at thumb web space 21.4  At base of 2nd digit 7.2  (Blank rows = not tested)  LANDMARK LEFT  eval  At axilla  39.9  15 cm proximal to olecranon process   10 cm proximal to olecranon process 36.3   Olecranon process 29.8  15 cm proximal to ulnar styloid process 28.8  10 cm proximal to ulnar styloid process 27.0  Just proximal to ulnar styloid process 19.8  Across hand at thumb web space 20.1  At base of 2nd digit 7  (Blank rows = not tested)   FUNCTIONAL TESTS:    GAIT: WNL  QUICK DASH SURVEY: 11.36  BREAST COMPLAINTS QUESTIONNAIRE Pain:4 Heaviness:3 Swollen feeling:3 Tense Skin:3 Redness:2 Bra Print:4 Size of Pores:1 Hard feeling: 5 Total:   25  /80 A Score over 9 indicates lymphedema issues in the breast    TODAY'S TREATMENT:  DATE:  05/21/23 In supine: Short neck, 5 diaphragmatic breaths, superficial and deep abdominals,  L axillary nodes and establishment of interaxillary pathway, R inguinal nodes and establishment of axilloinguinal pathway, then R breast moving fluid towards pathways spending extra time in any areas of fibrosis then retracing all steps and ending with LN's.  STM with cocoa butter to the Rt pectoralis PROM into flexion as tolerated  04/30/2023 Foam pad made for axillary border of bra to prevent bra digging in and causing more swelling in axilla Fibrosis noted right axillary area and lateral breast, and mildly at medial breast continued MLD to right breast while instructing pt and having her try all techniques. In supine: Short neck, 5 diaphragmatic breaths, L axillary nodes and establishment of interaxillary pathway, R inguinal nodes and establishment of axilloinguinal pathway, then R breast moving fluid towards pathways spending extra time in any areas of fibrosis then retracing all steps and ending with LN's. Showed fibrosis technique prior to MLD Therapist demonstrated and pt practiced all techniques.  04/22/2023 STM with cocoabutter to right UT, pectorals and lateral trunk Supine wand flexion x 3, scaption  x 2 complained of shoulder pain so held Tried PROM but pt too guarded to allow me to perform PROM Initiated lower trunk rotations with knees left x 3 to stretch right pectorals. Initiated MLD to right breast while instructing pt and having her try all techniques. In supine: Short neck, 5 diaphragmatic breaths, L axillary nodes and establishment of interaxillary pathway, R inguinal nodes and establishment of axilloinguinal pathway, then R breast moving fluid towards pathways spending extra time in any areas of fibrosis then retracing all steps and ending with LN's Therapist demonstrated and pt practiced all. She required VC's and TC's initially, but improved with practice and used good pressure. She had some difficulty getting her hand flat while doing the medial side of her breast. Gave pt written handout for MLD. Reminded to continue stretches to get pecs loosened up.  04/16/2023 Pt educated in supine stargazer stretch and abduction wall stretch to stretch tight muscles and to perform 2x/day holding 5-10 secs. Showed chart of lymphatics and explained the gentle nature of MLD that we will be doing for breast swelling due to superficial nature of lymphatics.    PATIENT EDUCATION:  Education details: Editor, Commissioning, wall slides for abd, Role of lymphatics Person educated: Patient Education method: Explanation and Handouts Education comprehension: verbalized understanding and returned demonstration  HOME EXERCISE PROGRAM: Stargazer stretch, wall slide for abd  ASSESSMENT:  CLINICAL IMPRESSION: Continued left breast MLD. Fibrosis was very mild today but with marks from bra noted still at medial breast.   OBJECTIVE IMPAIRMENTS: decreased activity tolerance, decreased knowledge of condition, decreased ROM, increased edema, impaired flexibility, impaired sensation, postural dysfunction, and pain.   ACTIVITY LIMITATIONS: lifting, sleeping, bed mobility, reach over head, and  hygiene/grooming  PARTICIPATION LIMITATIONS: cleaning and extremes of reaching  PERSONAL FACTORS: 1-2 comorbidities: Right breast Cancer s/p Lumpectomy and radiation  are also affecting patient's functional outcome.   REHAB POTENTIAL: Good  CLINICAL DECISION MAKING: Stable/uncomplicated  EVALUATION COMPLEXITY: Low  GOALS: Goals reviewed with patient? Yes  SHORT TERM GOALS=LONG TERM GOALS: Target date: 05/27/2022  Pt will be independent with use of compression bra/sports bra to decrease swelling Baseline: Goal status: INITIAL  2.  Breast Complaints survey will be no greater than 10  to demonstrate improved swelling Baseline: 25 Goal status: INITIAL  3.  Pt will be independent in MLD to the right breast to decrease  swelling. Baseline:  Goal status: INITIAL  4.  Pt will have no complaints of pectoral/lateral trunk tightness with right shoulder ROM Baseline:  Goal status: INITIAL  5.  Pt will report decreased swelling by atleast 50% Baseline:  Goal status: INITIAL  6.  Pt will have greater than 50% improvement in Right UQ pain Baseline:  Goal status: INITIAL   PLAN:  PT FREQUENCY: 2x/week  PT DURATION: 6 weeks  PLANNED INTERVENTIONS: 02835- PT Re-evaluation, 97110-Therapeutic exercises, 97112- Neuromuscular re-education, 97535- Self Care, 02859- Manual therapy, 97760- Orthotic Fit/training, Patient/Family education, Joint mobilization, Manual lymph drainage, Scar mobilization, Therapeutic exercises, Neuromuscular re-education, and Self Care  PLAN FOR NEXT SESSION: STM right pectorals, lateral trunk, UT,  AAROM and AROM (assess shoulder pain) review MLD to right breast and continue instructing pt. Give pics of LTR and doorway stretch.  Larue Saddie SAUNDERS, PT 05/21/2023, 5:07 PM

## 2023-05-23 ENCOUNTER — Encounter: Payer: BC Managed Care – PPO | Admitting: Rehabilitation

## 2023-05-29 ENCOUNTER — Telehealth: Payer: Self-pay | Admitting: *Deleted

## 2023-05-29 ENCOUNTER — Ambulatory Visit: Payer: 59

## 2023-05-29 DIAGNOSIS — Z9189 Other specified personal risk factors, not elsewhere classified: Secondary | ICD-10-CM

## 2023-05-29 DIAGNOSIS — C50211 Malignant neoplasm of upper-inner quadrant of right female breast: Secondary | ICD-10-CM | POA: Diagnosis not present

## 2023-05-29 DIAGNOSIS — Z170 Estrogen receptor positive status [ER+]: Secondary | ICD-10-CM

## 2023-05-29 DIAGNOSIS — Z483 Aftercare following surgery for neoplasm: Secondary | ICD-10-CM

## 2023-05-29 DIAGNOSIS — R293 Abnormal posture: Secondary | ICD-10-CM

## 2023-05-29 DIAGNOSIS — R600 Localized edema: Secondary | ICD-10-CM

## 2023-05-29 DIAGNOSIS — N63 Unspecified lump in unspecified breast: Secondary | ICD-10-CM

## 2023-05-29 NOTE — Therapy (Signed)
 OUTPATIENT PHYSICAL THERAPY  UPPER EXTREMITY ONCOLOGY TREATMENT  Patient Name: Jennifer Kane MRN: [id number] DOB:[birth year], 48 y.o., female Today's Date: 05/29/2023  END OF SESSION:  PT End of Session - 05/29/23 1601     Visit Number 5    Number of Visits 13    Date for PT Re-Evaluation 06/26/23    PT Start Time 1602    PT Stop Time 1655    PT Time Calculation (min) 53 min    Activity Tolerance Patient tolerated treatment well    Behavior During Therapy [facility] for tasks assessed/performed             Past Medical History:  Diagnosis Date   Anxiety    Asthma    Breast cancer (HCC) 08/07/2022   Diabetes (HCC)    Dysrhythmia    Hx of Irregular Heartbeat   Family history of brain cancer    Family history of breast cancer    Family history of ovarian cancer    GERD (gastroesophageal reflux disease)    History of panic attacks    History of tachycardia    Details not clear, reported abnormal tilt table test at age 33, on chronic low-dose beta-blocker   PONV (postoperative nausea and vomiting)    Past Surgical History:  Procedure Laterality Date   BREAST BIOPSY Right 08/07/2022   US  RT BREAST BX W LOC DEV 1ST LESION IMG BX SPEC US  GUIDE 08/07/2022 [facility]   BREAST BIOPSY  09/04/2022   US  RT RADIOACTIVE SEED LOC 09/04/2022 [facility]   BREAST LUMPECTOMY WITH RADIOACTIVE SEED AND SENTINEL LYMPH NODE BIOPSY Right 09/06/2022   Procedure: RIGHT BREAST LUMPECTOMY WITH RADIOACTIVE SEED AND SENTINEL LYMPH NODE BIOPSY;  Surgeon: Oza Blumenthal, MD;  Location: [facility] OR;  Service: General;  Laterality: Right;   CHOLECYSTECTOMY N/A 05/01/2018   Procedure: LAPAROSCOPIC CHOLECYSTECTOMY;  Surgeon: Derral Flick, MD;  Location: [facility];  Service: General;  Laterality: N/A;   KNEE ARTHROSCOPY  1989   No previous surgeries     WISDOM TOOTH EXTRACTION     Patient Active Problem List   Diagnosis Date Noted   Genetic testing 09/24/2022   Family history of breast  cancer 09/12/2022   Family history of ovarian cancer 09/12/2022   Family history of brain cancer 09/12/2022   Malignant neoplasm of upper-inner quadrant of right breast in female, estrogen receptor positive (HCC) 08/24/2022   Moderate persistent asthma, uncomplicated 01/31/2022   Seasonal and perennial allergic rhinitis 01/31/2022   Anaphylactic shock due to adverse food reaction 01/31/2022     REFERRING PROVIDER: Cameron Cea, MD  REFERRING DIAG: right breast Lymphedema s/p Lumpectomy  THERAPY DIAG:  Malignant neoplasm of upper-inner quadrant of right breast in female, estrogen receptor positive (HCC) - Plan: PT plan of care cert/re-cert  Aftercare following surgery for neoplasm - Plan: PT plan of care cert/re-cert  Localized edema - Plan: PT plan of care cert/re-cert  Breast swelling - Plan: PT plan of care cert/re-cert  At risk for lymphedema - Plan: PT plan of care cert/re-cert  Abnormal posture - Plan: PT plan of care cert/re-cert  ONSET DATE: 12/2022  Rationale for Evaluation and Treatment: Rehabilitation  SUBJECTIVE:  SUBJECTIVE STATEMENT:  Not having the pain I was having before in the right breast, but the breast is still swollen, although it is better. The pad seems to help underneath and its not as firm as it was. I do my MLD usually every day. I still have tightness in the chest area but overall pain is improved  PERTINENT HISTORY:  Patient was diagnosed with right grade 2 IDC. It measures 1.1 cm. It is ER/PR positive with a Ki67 of 10%. Rt breast lumpectomy and SLNB on 09/06/22 with 7 negative nodes removed No benefit to chemotherapy and pt had radiation and antiestrogen therapy. Other hx includes asthma, DM, Right shoulder pain from cross fit  PAIN:  Are you having pain?  No  PRECAUTIONS: Right UE Lymphedema risk, DM, Asthma, prior injury to right shoulder doing crossfit  RED FLAGS: None   WEIGHT BEARING RESTRICTIONS: No  FALLS:  Has patient fallen in last 6 months? No  LIVING ENVIRONMENT: Lives with: lives with their spouse   OCCUPATION: kindergarten teacher   LEISURE: nothing right now  HAND DOMINANCE: right   PRIOR LEVEL OF FUNCTION: Independent  PATIENT GOALS: To decrease right breast swelling, discomfort   OBJECTIVE: Note: Objective measures were completed at Evaluation unless otherwise noted.  COGNITION: Overall cognitive status: Within functional limits for tasks assessed   PALPATION: Tender right pectorals, inferior axilla, lateral arm at mid delt  OBSERVATIONS / OTHER ASSESSMENTS: generalized right breast swelling and axillary swelling, mild peau d'orange inferior medial breast, New burn on right breast from spilling [place] oil on Thanksgiving, mild redness right breast. Fibrosis noted in axilla, and medial breast  SENSATION: Light touch: Deficits    POSTURE: Forward head, rounded shoulders  UPPER EXTREMITY AROM/PROM:  A/PROM RIGHT   eval   Shoulder extension 50, tight outer arm  Shoulder flexion 160 tight pecs  Shoulder abduction 170  Shoulder internal rotation 60 pain  Shoulder external rotation NT    (Blank rows = not tested)  A/PROM LEFT   eval  Shoulder extension 43  Shoulder flexion 167  Shoulder abduction 177  Shoulder internal rotation 60  Shoulder external rotation 100    (Blank rows = not tested)  CERVICAL AROM: All within functional limits:      UPPER EXTREMITY STRENGTH:   LYMPHEDEMA ASSESSMENTS:   SURGERY TYPE/DATE: Rt breast lumpectomy and SLNB on 09/06/22   NUMBER OF LYMPH NODES REMOVED: 0/7  CHEMOTHERAPY: No  RADIATION:YES, 11/06/2022-12/04/2022  HORMONE TREATMENT: YES, Tamoxifen   INFECTIONS: NO   LYMPHEDEMA ASSESSMENTS:   LANDMARK RIGHT  eval  At axilla  39.7  15 cm  proximal to olecranon process   10 cm proximal to olecranon process 36.5  Olecranon process 30.3  15 cm proximal to ulnar styloid process 30.5  10 cm proximal to ulnar styloid process 28.2  Just proximal to ulnar styloid process 19.7  Across hand at thumb web space 21.4  At base of 2nd digit 7.2  (Blank rows = not tested)  LANDMARK LEFT  eval  At axilla  39.9  15 cm proximal to olecranon process   10 cm proximal to olecranon process 36.3  Olecranon process 29.8  15 cm proximal to ulnar styloid process 28.8  10 cm proximal to ulnar styloid process 27.0  Just proximal to ulnar styloid process 19.8  Across hand at thumb web space 20.1  At base of 2nd digit 7  (Blank rows = not tested)   FUNCTIONAL TESTS:    GAIT: WNL  QUICK DASH  SURVEY: 11.36  BREAST COMPLAINTS QUESTIONNAIRE Pain:4 Heaviness:3 Swollen feeling:3 Tense Skin:3 Redness:2 Bra Print:4 Size of Pores:1 Hard feeling: 5 Total:   25  /80 A Score over 9 indicates lymphedema issues in the breast  BREAST COMPLAINTS QUESTIONNAIRE( 05/29/2023) Pain:0 Heaviness:0 Swollen feeling:5 Tense Skin:2 Redness:0 Bra Print:10 Size of Pores:6 Hard feeling: 5 Total:    28 /80 A Score over 9 indicates lymphedema issues in the breast   TODAY'S TREATMENT:                                                                                                                                          DATE:  05/29/2023 Pt completed breast complaints survey and we discussed all goals for recert Lateral breast without enlarged pores, but enlarged pores noted medially. Some swelling noted in axillary region. Significant bra print on medial breast where compression bra flap was not in place. Fixed tonight so pt will see how it does. In supine: Short neck, superficial and deep abdominals,, L axillary nodes and establishment of interaxillary pathway, R inguinal nodes and establishment of axilloinguinal pathway, then R breast moving fluid  towards pathways spending extra time in any areas of fibrosis and on medial breast then retracing all steps and ending with LN's. Had pt don compression bra and showed how to pull Zipper flap across Gave flexi touch info to look at.  05/21/23 In supine: Short neck, 5 diaphragmatic breaths, superficial and deep abdominals,  L axillary nodes and establishment of interaxillary pathway, R inguinal nodes and establishment of axilloinguinal pathway, then R breast moving fluid towards pathways spending extra time in any areas of fibrosis then retracing all steps and ending with LN's.  STM with cocoa butter to the Rt pectoralis PROM into flexion as tolerated  04/30/2023 Foam pad made for axillary border of bra to prevent bra digging in and causing more swelling in axilla Fibrosis noted right axillary area and lateral breast, and mildly at medial breast continued MLD to right breast while instructing pt and having her try all techniques. In supine: Short neck, 5 diaphragmatic breaths, L axillary nodes and establishment of interaxillary pathway, R inguinal nodes and establishment of axilloinguinal pathway, then R breast moving fluid towards pathways spending extra time in any areas of fibrosis then retracing all steps and ending with LN's. Showed fibrosis technique prior to MLD Therapist demonstrated and pt practiced all techniques.  04/22/2023 STM with cocoabutter to right UT, pectorals and lateral trunk Supine wand flexion x 3, scaption x 2 complained of shoulder pain so held Tried PROM but pt too guarded to allow me to perform PROM Initiated lower trunk rotations with knees left x 3 to stretch right pectorals. Initiated MLD to right breast while instructing pt and having her try all techniques. In supine: Short neck, 5 diaphragmatic breaths, L axillary nodes and establishment of interaxillary pathway, R inguinal nodes and establishment of  axilloinguinal pathway, then R breast moving fluid towards  pathways spending extra time in any areas of fibrosis then retracing all steps and ending with LN's Therapist demonstrated and pt practiced all. She required VC's and TC's initially, but improved with practice and used good pressure. She had some difficulty getting her hand flat while doing the medial side of her breast. Gave pt written handout for MLD. Reminded to continue stretches to get pecs loosened up.  04/16/2023 Pt educated in supine stargazer stretch and abduction wall stretch to stretch tight muscles and to perform 2x/day holding 5-10 secs. Showed chart of lymphatics and explained the gentle nature of MLD that we will be doing for breast swelling due to superficial nature of lymphatics.    PATIENT EDUCATION:  Education details: Editor, commissioning, wall slides for abd, Role of lymphatics Person educated: Patient Education method: Explanation and Handouts Education comprehension: verbalized understanding and returned demonstration  HOME EXERCISE PROGRAM: Stargazer stretch, wall slide for abd  ASSESSMENT:  CLINICAL IMPRESSION: Pt has met 3 out of 6 goals. She notes overall improved upper quarter pain and less tightness, although chest tightness does remain. Swelling has improved some, however she has not achieved her swelling goal of being decreased by 50%. She is compliant with her compression bra. She still gets a fairly significant bra imprint at the medial breast which may be improved with the zipper flap being in the right place. Pt missed a number of visits earlier due to conflicts and bad weather.She will benefit from continued PT to address remaining goals not yet achieved.  OBJECTIVE IMPAIRMENTS: decreased activity tolerance, decreased knowledge of condition, decreased ROM, increased edema, impaired flexibility, impaired sensation, postural dysfunction, and pain.   ACTIVITY LIMITATIONS: lifting, sleeping, bed mobility, reach over head, and hygiene/grooming  PARTICIPATION LIMITATIONS:  cleaning and extremes of reaching  PERSONAL FACTORS: 1-2 comorbidities: Right breast Cancer s/p Lumpectomy and radiation  are also affecting patient's functional outcome.   REHAB POTENTIAL: Good  CLINICAL DECISION MAKING: Stable/uncomplicated  EVALUATION COMPLEXITY: Low  GOALS: Goals reviewed with patient? Yes  SHORT TERM GOALS=LONG TERM GOALS: Target date: 05/27/2022  Pt will be independent with use of compression bra/sports bra to decrease swelling Baseline: Goal status: MET 05/29/2023 2.  Breast Complaints survey will be no greater than 10  to demonstrate improved swelling Baseline: 25 Goal status: IN PROGRESS 3.  Pt will be independent in MLD to the right breast to decrease swelling. Baseline:  Goal status: MET 1/15/.2025 4.  Pt will have no complaints of pectoral/lateral trunk tightness with right shoulder ROM Baseline:  Goal status: In PROGRESS, but improving 5.  Pt will report decreased swelling by atleast 50% Baseline:  Goal status:IN PROGRESS 6.  Pt will have greater than 50% improvement in Right UQ pain Baseline:  Goal status: MET 05/29/2023   PLAN:  PT FREQUENCY: 2x/week  PT DURATION: 3-4 weeks  PLANNED INTERVENTIONS: 40981- PT Re-evaluation, 97110-Therapeutic exercises, 97112- Neuromuscular re-education, 97535- Self Care, [id number]- Manual therapy, [id number]- Orthotic Fit/training, Patient/Family education, Joint mobilization, Manual lymph drainage, Scar mobilization, Therapeutic exercises, Neuromuscular re-education, and Self Care  PLAN FOR NEXT SESSION: STM right pectorals, lateral trunk, UT,  AAROM and AROM (assess shoulder pain) Continue MLD to right breast and continue instructing pt. Give pics of LTR and doorway stretch., Flexi touch?  Latisha Poland, PT 05/29/2023, 5:38 PM

## 2023-05-29 NOTE — Telephone Encounter (Signed)
 Received call from pt with complaint of ongoing pelvic pain and frequent UTI's despite being on low dose antibiotics.  Pt requesting office visit to further discuss the need for Tamoxifen . Appt scheduled. Pt educated and verbalized understanding.

## 2023-06-03 ENCOUNTER — Ambulatory Visit: Payer: 59

## 2023-06-03 DIAGNOSIS — Z9189 Other specified personal risk factors, not elsewhere classified: Secondary | ICD-10-CM

## 2023-06-03 DIAGNOSIS — Z483 Aftercare following surgery for neoplasm: Secondary | ICD-10-CM

## 2023-06-03 DIAGNOSIS — R600 Localized edema: Secondary | ICD-10-CM

## 2023-06-03 DIAGNOSIS — Z170 Estrogen receptor positive status [ER+]: Secondary | ICD-10-CM

## 2023-06-03 DIAGNOSIS — N63 Unspecified lump in unspecified breast: Secondary | ICD-10-CM

## 2023-06-03 DIAGNOSIS — C50211 Malignant neoplasm of upper-inner quadrant of right female breast: Secondary | ICD-10-CM | POA: Diagnosis not present

## 2023-06-03 DIAGNOSIS — R293 Abnormal posture: Secondary | ICD-10-CM

## 2023-06-03 NOTE — Therapy (Signed)
OUTPATIENT PHYSICAL THERAPY  UPPER EXTREMITY ONCOLOGY TREATMENT  Patient Name: Jennifer Kane MRN: [id number] DOB:1976/04/08, 48 y.o., female Today's Date: 06/03/2023  END OF SESSION:  PT End of Session - 06/03/23 0806     Visit Number 6    Number of Visits 13    Date for PT Re-Evaluation 06/26/23    PT Start Time 0802    PT Stop Time 0856    PT Time Calculation (min) 54 min    Activity Tolerance Patient tolerated treatment well    Behavior During Therapy [facility] for tasks assessed/performed             Past Medical History:  Diagnosis Date   Anxiety    Asthma    Breast cancer (HCC) 08/07/2022   Diabetes (HCC)    Dysrhythmia    Hx of Irregular Heartbeat   Family history of brain cancer    Family history of breast cancer    Family history of ovarian cancer    GERD (gastroesophageal reflux disease)    History of panic attacks    History of tachycardia    Details not clear, reported abnormal tilt table test at age 49, on chronic low-dose beta-blocker   PONV (postoperative nausea and vomiting)    Past Surgical History:  Procedure Laterality Date   BREAST BIOPSY Right 08/07/2022   [place] RT BREAST BX W LOC DEV 1ST LESION IMG BX SPEC US GUIDE 08/07/2022 [facility]   BREAST BIOPSY  09/04/2022   [place] RT RADIOACTIVE SEED LOC 09/04/2022 [facility]   BREAST LUMPECTOMY WITH RADIOACTIVE SEED AND SENTINEL LYMPH NODE BIOPSY Right 09/06/2022   Procedure: RIGHT BREAST LUMPECTOMY WITH RADIOACTIVE SEED AND SENTINEL LYMPH NODE BIOPSY;  Surgeon: Abigail Miyamoto, MD;  Location: [facility] OR;  Service: General;  Laterality: Right;   CHOLECYSTECTOMY N/A 05/01/2018   Procedure: LAPAROSCOPIC CHOLECYSTECTOMY;  Surgeon: Rodman Pickle, MD;  Location: [facility];  Service: General;  Laterality: N/A;   KNEE ARTHROSCOPY  1989   No previous surgeries     WISDOM TOOTH EXTRACTION     Patient Active Problem List   Diagnosis Date Noted   Genetic testing 09/24/2022   Family history of breast  cancer 09/12/2022   Family history of ovarian cancer 09/12/2022   Family history of brain cancer 09/12/2022   Malignant neoplasm of upper-inner quadrant of right breast in female, estrogen receptor positive (HCC) 08/24/2022   Moderate persistent asthma, uncomplicated 01/31/2022   Seasonal and perennial allergic rhinitis 01/31/2022   Anaphylactic shock due to adverse food reaction 01/31/2022     REFERRING PROVIDER: Serena Croissant, MD  REFERRING DIAG: right breast Lymphedema s/p Lumpectomy  THERAPY DIAG:  Malignant neoplasm of upper-inner quadrant of right breast in female, estrogen receptor positive [facility])  Aftercare following surgery for neoplasm  Localized edema  Breast swelling  At risk for lymphedema  Abnormal posture  ONSET DATE: 12/2022  Rationale for Evaluation and Treatment: Rehabilitation  SUBJECTIVE:  SUBJECTIVE STATEMENT:  I can tell my Rt breast is getting better overall.   PERTINENT HISTORY:  Patient was diagnosed with right grade 2 IDC. It measures 1.1 cm. It is ER/PR positive with a Ki67 of 10%. Rt breast lumpectomy and SLNB on 09/06/22 with 7 negative nodes removed No benefit to chemotherapy and pt had radiation and antiestrogen therapy. Other hx includes asthma, DM, Right shoulder pain from cross fit  PAIN:  Are you having pain? No  PRECAUTIONS: Right UE Lymphedema risk, DM, Asthma, prior injury to right shoulder doing crossfit  RED FLAGS: None   WEIGHT BEARING RESTRICTIONS: No  FALLS:  Has patient fallen in last 6 months? No  LIVING ENVIRONMENT: Lives with: lives with their spouse   OCCUPATION: kindergarten teacher   LEISURE: nothing right now  HAND DOMINANCE: right   PRIOR LEVEL OF FUNCTION: Independent  PATIENT GOALS: To decrease right breast swelling,  discomfort   OBJECTIVE: Note: Objective measures were completed at Evaluation unless otherwise noted.  COGNITION: Overall cognitive status: Within functional limits for tasks assessed   PALPATION: Tender right pectorals, inferior axilla, lateral arm at mid delt  OBSERVATIONS / OTHER ASSESSMENTS: generalized right breast swelling and axillary swelling, mild peau d'orange inferior medial breast, New burn on right breast from spilling [place] oil on Thanksgiving, mild redness right breast. Fibrosis noted in axilla, and medial breast  SENSATION: Light touch: Deficits    POSTURE: Forward head, rounded shoulders  UPPER EXTREMITY AROM/PROM:  A/PROM RIGHT   eval   Shoulder extension 50, tight outer arm  Shoulder flexion 160 tight pecs  Shoulder abduction 170  Shoulder internal rotation 60 pain  Shoulder external rotation NT    (Blank rows = not tested)  A/PROM LEFT   eval  Shoulder extension 43  Shoulder flexion 167  Shoulder abduction 177  Shoulder internal rotation 60  Shoulder external rotation 100    (Blank rows = not tested)  CERVICAL AROM: All within functional limits:      UPPER EXTREMITY STRENGTH:   LYMPHEDEMA ASSESSMENTS:   SURGERY TYPE/DATE: Rt breast lumpectomy and SLNB on 09/06/22   NUMBER OF LYMPH NODES REMOVED: 0/7  CHEMOTHERAPY: No  RADIATION:YES, 11/06/2022-12/04/2022  HORMONE TREATMENT: YES, Tamoxifen  INFECTIONS: NO   LYMPHEDEMA ASSESSMENTS:   LANDMARK RIGHT  eval  At axilla  39.7  15 cm proximal to olecranon process   10 cm proximal to olecranon process 36.5  Olecranon process 30.3  15 cm proximal to ulnar styloid process 30.5  10 cm proximal to ulnar styloid process 28.2  Just proximal to ulnar styloid process 19.7  Across hand at thumb web space 21.4  At base of 2nd digit 7.2  (Blank rows = not tested)  LANDMARK LEFT  eval  At axilla  39.9  15 cm proximal to olecranon process   10 cm proximal to olecranon process 36.3   Olecranon process 29.8  15 cm proximal to ulnar styloid process 28.8  10 cm proximal to ulnar styloid process 27.0  Just proximal to ulnar styloid process 19.8  Across hand at thumb web space 20.1  At base of 2nd digit 7  (Blank rows = not tested)   FUNCTIONAL TESTS:    GAIT: WNL  QUICK DASH SURVEY: 11.36  BREAST COMPLAINTS QUESTIONNAIRE Pain:4 Heaviness:3 Swollen feeling:3 Tense Skin:3 Redness:2 Bra Print:4 Size of Pores:1 Hard feeling: 5 Total:   25  /80 A Score over 9 indicates lymphedema issues in the breast  BREAST COMPLAINTS QUESTIONNAIRE( 05/29/2023) Pain:0 Heaviness:0 Swollen  feeling:5 Tense Skin:2 Redness:0 Bra Print:10 Size of Pores:6 Hard feeling: 5 Total:    28 /80 A Score over 9 indicates lymphedema issues in the breast   TODAY'S TREATMENT:                                                                                                                                          DATE:  06/03/23: Manual Therapy In supine: Short neck, superficial and deep abdominals, Lt axillary nodes, Lt intact anterior thorax sequence, and establishment of anterior inter-axillary pathway, Rt inguinal nodes and establishment of Rt axillo-inguinal pathway, then Rt breast moving fluid towards pathways spending extra time in any areas of fibrosis and on medial breast then into Lt S/L for focus to lateral breast, then finished retracing all steps in supine and ending with LN's. STM to Rt lateral trunk and axilla in supine at end P/ROMs of Rt shoulder, then into Lt S/L for focus to medial scap border where pt palpably tight.  P/ROM to Rt shoulder in supine into end motions of flex, abd and D2 with scap depression by therapist throughout  05/29/2023 Pt completed breast complaints survey and we discussed all goals for recert Lateral breast without enlarged pores, but enlarged pores noted medially. Some swelling noted in axillary region. Significant bra print on medial breast where  compression bra flap was not in place. Fixed tonight so pt will see how it does. In supine: Short neck, superficial and deep abdominals,, L axillary nodes and establishment of interaxillary pathway, R inguinal nodes and establishment of axilloinguinal pathway, then R breast moving fluid towards pathways spending extra time in any areas of fibrosis and on medial breast then retracing all steps and ending with LN's. Had pt don compression bra and showed how to pull Zipper flap across Gave flexi touch info to look at.  05/21/23 In supine: Short neck, 5 diaphragmatic breaths, superficial and deep abdominals,  L axillary nodes and establishment of interaxillary pathway, R inguinal nodes and establishment of axilloinguinal pathway, then R breast moving fluid towards pathways spending extra time in any areas of fibrosis then retracing all steps and ending with LN's.  STM with cocoa butter to the Rt pectoralis PROM into flexion as tolerated  04/30/2023 Foam pad made for axillary border of bra to prevent bra digging in and causing more swelling in axilla Fibrosis noted right axillary area and lateral breast, and mildly at medial breast continued MLD to right breast while instructing pt and having her try all techniques. In supine: Short neck, 5 diaphragmatic breaths, L axillary nodes and establishment of interaxillary pathway, R inguinal nodes and establishment of axilloinguinal pathway, then R breast moving fluid towards pathways spending extra time in any areas of fibrosis then retracing all steps and ending with LN's. Showed fibrosis technique prior to MLD Therapist demonstrated and pt practiced all techniques.  04/22/2023 STM with cocoabutter to right  UT, pectorals and lateral trunk Supine wand flexion x 3, scaption x 2 complained of shoulder pain so held Tried PROM but pt too guarded to allow me to perform PROM Initiated lower trunk rotations with knees left x 3 to stretch right pectorals. Initiated  MLD to right breast while instructing pt and having her try all techniques. In supine: Short neck, 5 diaphragmatic breaths, L axillary nodes and establishment of interaxillary pathway, R inguinal nodes and establishment of axilloinguinal pathway, then R breast moving fluid towards pathways spending extra time in any areas of fibrosis then retracing all steps and ending with LN's Therapist demonstrated and pt practiced all. She required VC's and TC's initially, but improved with practice and used good pressure. She had some difficulty getting her hand flat while doing the medial side of her breast. Gave pt written handout for MLD. Reminded to continue stretches to get pecs loosened up.  04/16/2023 Pt educated in supine stargazer stretch and abduction wall stretch to stretch tight muscles and to perform 2x/day holding 5-10 secs. Showed chart of lymphatics and explained the gentle nature of MLD that we will be doing for breast swelling due to superficial nature of lymphatics.    PATIENT EDUCATION:  Education details: Editor, commissioning, wall slides for abd, Role of lymphatics Person educated: Patient Education method: Explanation and Handouts Education comprehension: verbalized understanding and returned demonstration  HOME EXERCISE PROGRAM: Stargazer stretch, wall slide for abd  ASSESSMENT:  CLINICAL IMPRESSION: Continued with manual therapies working to decrease lymphedema and fascial restrictions that limit end Rt shoulder motions. Encouraged pt to work on incorporating her HEP stretches into her day, especially when seated at her desk. Pt verbalized good understanding.   OBJECTIVE IMPAIRMENTS: decreased activity tolerance, decreased knowledge of condition, decreased ROM, increased edema, impaired flexibility, impaired sensation, postural dysfunction, and pain.   ACTIVITY LIMITATIONS: lifting, sleeping, bed mobility, reach over head, and hygiene/grooming  PARTICIPATION LIMITATIONS: cleaning and  extremes of reaching  PERSONAL FACTORS: 1-2 comorbidities: Right breast Cancer s/p Lumpectomy and radiation  are also affecting patient's functional outcome.   REHAB POTENTIAL: Good  CLINICAL DECISION MAKING: Stable/uncomplicated  EVALUATION COMPLEXITY: Low  GOALS: Goals reviewed with patient? Yes  SHORT TERM GOALS=LONG TERM GOALS: Target date: 05/27/2022  Pt will be independent with use of compression bra/sports bra to decrease swelling Baseline: Goal status: MET 05/29/2023 2.  Breast Complaints survey will be no greater than 10  to demonstrate improved swelling Baseline: 25 Goal status: IN PROGRESS 3.  Pt will be independent in MLD to the right breast to decrease swelling. Baseline:  Goal status: MET 1/15/.2025 4.  Pt will have no complaints of pectoral/lateral trunk tightness with right shoulder ROM Baseline:  Goal status: In PROGRESS, but improving 5.  Pt will report decreased swelling by atleast 50% Baseline:  Goal status:IN PROGRESS 6.  Pt will have greater than 50% improvement in Right UQ pain Baseline:  Goal status: MET 05/29/2023   PLAN:  PT FREQUENCY: 2x/week  PT DURATION: 3-4 weeks  PLANNED INTERVENTIONS: 16109- PT Re-evaluation, 97110-Therapeutic exercises, 97112- Neuromuscular re-education, 97535- Self Care, 60454- Manual therapy, 97760- Orthotic Fit/training, Patient/Family education, Joint mobilization, Manual lymph drainage, Scar mobilization, Therapeutic exercises, Neuromuscular re-education, and Self Care  PLAN FOR NEXT SESSION: STM right pectorals, lateral trunk, UT,  AAROM and AROM (assess shoulder pain) Continue MLD to right breast and continue instructing pt. Give pics of LTR and doorway stretch., Flexi touch?  Hermenia Bers, PTA 06/03/2023, 9:03 AM

## 2023-06-04 ENCOUNTER — Inpatient Hospital Stay: Payer: 59 | Attending: Adult Health | Admitting: Adult Health

## 2023-06-04 ENCOUNTER — Encounter: Payer: Self-pay | Admitting: Adult Health

## 2023-06-04 VITALS — BP 112/77 | HR 96 | Temp 97.8°F | Resp 18 | Ht 63.0 in | Wt 230.8 lb

## 2023-06-04 DIAGNOSIS — Z170 Estrogen receptor positive status [ER+]: Secondary | ICD-10-CM | POA: Insufficient documentation

## 2023-06-04 DIAGNOSIS — Z79810 Long term (current) use of selective estrogen receptor modulators (SERMs): Secondary | ICD-10-CM | POA: Insufficient documentation

## 2023-06-04 DIAGNOSIS — C50211 Malignant neoplasm of upper-inner quadrant of right female breast: Secondary | ICD-10-CM | POA: Diagnosis not present

## 2023-06-04 NOTE — Progress Notes (Signed)
[facility] Cancer Follow up:    Jennifer Shivers, PA-C [address] [place] [address]   DIAGNOSIS:  Cancer Staging  Malignant neoplasm of upper-inner quadrant of right breast in female, estrogen receptor positive (HCC) Staging form: Breast, AJCC 8th Edition - Clinical: Stage IA (cT1c, cN0, cM0, G2, ER+, PR+, HER2-) - Signed by Serena Croissant, MD on 09/04/2022 Stage prefix: Initial diagnosis Histologic grading system: 3 grade system   SUMMARY OF ONCOLOGIC HISTORY: Oncology History  Malignant neoplasm of upper-inner quadrant of right breast in female, estrogen receptor positive (HCC)  08/07/2022 Initial Diagnosis   Mammogram detected suspicious mass in the right breast 12:30 position 1.1 cm (no evidence of malignancy in the area of concern in the LIQ) biopsy: Grade 2 IDC ER 95%, PR 100%, Ki67 10%, HER2 2+ by IHC negative by FISH ratio 1.09, copy #1.8   09/04/2022 Cancer Staging   Staging form: Breast, AJCC 8th Edition - Clinical: Stage IA (cT1c, cN0, cM0, G2, ER+, PR+, HER2-) - Signed by Serena Croissant, MD on 09/04/2022 Stage prefix: Initial diagnosis Histologic grading system: 3 grade system   09/06/2022 Surgery   Right lumpectomy: Grade 2 IDC 1.5 cm margins -0/7 lymph nodes negative, ER 95%, PR 100%, HER2 negative, Ki-67 10%   09/18/2022 Oncotype testing   Oncotype DX recurrence score 20 (risk of distant) and 9 years: 6%) no benefit of chemo   09/23/2022 Genetic Testing   Negative genetic testing on the Multi-cancer+RNAinsight panel.  The report date is Sep 23, 2022.  The Multi-Cancer + RNA Panel offered by [facility] includes sequencing and/or deletion/duplication analysis of the following 70 genes:  AIP*, ALK, APC*, ATM*, AXIN2*, BAP1*, BARD1*, BLM*, BMPR1A*, BRCA1*, BRCA2*, BRIP1*, CDC73*, CDH1*, CDK4, CDKN1B*, CDKN2A, CHEK2*, CTNNA1*, DICER1*, EPCAM (del/dup only), EGFR, FH*, FLCN*, GREM1 (promoter dup only), HOXB13, KIT, LZTR1, MAX*, MBD4, MEN1*, MET, MITF, MLH1*,  MSH2*, MSH3*, MSH6*, MUTYH*, NF1*, NF2*, NTHL1*, PALB2*, PDGFRA, PMS2*, POLD1*, POLE*, POT1*, PRKAR1A*, PTCH1*, PTEN*, RAD51C*, RAD51D*, RB1*, RET, SDHA* (sequencing only), SDHAF2*, SDHB*, SDHC*, SDHD*, SMAD4*, SMARCA4*, SMARCB1*, SMARCE1*, STK11*, SUFU*, TMEM127*, TP53*, TSC1*, TSC2*, VHL*. RNA analysis is performed for * genes.   11/06/2022 - 12/04/2022 Radiation Therapy   Plan Name: Breast_R Site: Breast, Right Technique: 3D Mode: Photon Dose Per Fraction: 1.8 Gy Prescribed Dose (Delivered / Prescribed): 1.8 Gy / 50.4 Gy Prescribed Fxs (Delivered / Prescribed): 1 / 28   Plan Name: Breast_R_Bst1 Site: Breast, Right Technique: 3D Mode: Photon Dose Per Fraction: 2.66 Gy Prescribed Dose (Delivered / Prescribed): 39.9 Gy / 39.9 Gy Prescribed Fxs (Delivered / Prescribed): 15 / 15   Plan Name: Breast_R_Bst2 Site: Breast, Right Technique: 3D Mode: Photon Dose Per Fraction: 2.5 Gy Prescribed Dose (Delivered / Prescribed): 10 Gy / 10 Gy Prescribed Fxs (Delivered / Prescribed): 4 / 4   12/2022 -  Anti-estrogen oral therapy   Tamoxifen     CURRENT THERAPY: Tamoxifen  INTERVAL HISTORY:  Jennifer Kane 48 y.o. female returns for follow-up of pelvic discomfort and recurrent UTIs since starting tamoxifen.  Since mid November she has been on about 6 antibiotics.  She has an appointment in mid February with Dr. Arita Miss at [facility] urology.  She notes she has a history of cysts and has a repeat ultrasound to evaluate her fibroids and cysts.   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  MEDICAL HISTORY: Past Medical History:  Diagnosis Date   Anxiety    Asthma    Breast cancer (HCC) 08/07/2022   Diabetes (HCC)    Dysrhythmia    Hx of Irregular Heartbeat   Family history of brain cancer    Family history of breast cancer    Family history of ovarian cancer    GERD (gastroesophageal reflux disease)    History of panic attacks    History of tachycardia    Details not clear, reported abnormal tilt table test at age 69, on chronic low-dose beta-blocker   PONV (postoperative nausea and vomiting)     SURGICAL HISTORY: Past Surgical History:  Procedure Laterality Date   BREAST BIOPSY Right 08/07/2022   [place] RT BREAST BX W LOC DEV 1ST LESION IMG BX SPEC US GUIDE 08/07/2022 [facility]   BREAST BIOPSY  09/04/2022   [place] RT RADIOACTIVE SEED LOC 09/04/2022 [facility] MAMMOGR[facility]   BREAST LUMPECTOMY WITH RADIOACTIVE SEED AND SENTINEL LYMPH NODE BIOPSY Right 09/06/2022   Procedure: RIGHT BREAST LUMPECTOMY WITH RADIOACTIVE SEED AND SENTINEL LYMPH NODE BIOPSY;  Surgeon: Abigail Miyamoto, MD;  Location: [facility] OR;  Service: General;  Laterality: Right;   CHOLECYSTECTOMY N/A 05/01/2018   Procedure: LAPAROSCOPIC CHOLECYSTECTOMY;  Surgeon: Rodman Pickle, MD;  Location: [facility] ORS;  Service: General;  Laterality: N/A;   KNEE ARTHROSCOPY  1989   No previous surgeries     WISDOM TOOTH EXTRACTION      SOCIAL HISTORY: Social History   Socioeconomic History   Marital status: Married    Spouse name: Not on file   Number of children: Not on file   Years of education: Not on file   Highest education level: Not on file  Occupational History   Not on file  Tobacco Use   Smoking status: Never   Smokeless tobacco: Never  Vaping Use   Vaping status: Never Used  Substance and Sexual Activity   Alcohol use: Yes    Comment: rarely   Drug use: No   Sexual activity: Not on file  Other Topics Concern    Not on file  Social History Narrative   Not on file   Social Drivers of Health   Financial Resource Strain: Low Risk  (01/13/2019)   Received from [facility]   Overall Financial Resource Strain (CARDIA)    Difficulty of Paying Living Expenses: Not hard at all  Food Insecurity: No Food Insecurity (10/18/2022)   Hunger Vital Sign    Worried About Running Out of Food in the Last Year: Never true    Ran Out of Food in the Last Year: Never true  Transportation Needs: No Transportation Needs (10/18/2022)   PRAPARE - Administrator, Civil Service (Medical): No    Lack of Transportation (Non-Medical): No  Physical Activity: Not on file  Stress: No Stress Concern Present (01/13/2019)   Received from [facility], [facility] - Occupational Stress Questionnaire    Feeling of Stress : Not at all  Social Connections: Not on file  Intimate Partner Violence: Not At Risk (04/25/2023)  Received from [facility]   Humiliation, Afraid, Rape, and Kick questionnaire    Fear of Current or Ex-Partner: No    Emotionally Abused: No    Physically Abused: No    Sexually Abused: No    FAMILY HISTORY: Family History  Problem Relation Age of Onset   Breast cancer Mother 57   CAD Father    Ovarian cancer Maternal Aunt 40       d. 1   Breast cancer Paternal Aunt 21   Lung cancer Paternal Aunt        non-smoker   Stroke Maternal Grandmother    Stroke Maternal Grandfather    Breast cancer Cousin 12       second cancer at 81; Maternal 1st Cousin   Brain cancer Cousin 32       optic nerve tumor   Brain cancer Nephew 11       glioblastoma   Colon cancer Neg Hx    Colon polyps Neg Hx    Esophageal cancer Neg Hx    Rectal cancer Neg Hx    Stomach cancer Neg Hx     Review of Systems  Constitutional:  Negative for appetite change, chills, fatigue, fever and unexpected weight change.  HENT:   Negative for hearing  loss, lump/mass and trouble swallowing.   Eyes:  Negative for eye problems and icterus.  Respiratory:  Negative for chest tightness, cough and shortness of breath.   Cardiovascular:  Negative for chest pain, leg swelling and palpitations.  Gastrointestinal:  Negative for abdominal distention, abdominal pain, constipation, diarrhea, nausea and vomiting.  Endocrine: Negative for hot flashes.  Genitourinary:  Negative for difficulty urinating.   Musculoskeletal:  Negative for arthralgias.  Skin:  Negative for itching and rash.  Neurological:  Negative for dizziness, extremity weakness, headaches and numbness.  Hematological:  Negative for adenopathy. Does not bruise/bleed easily.  Psychiatric/Behavioral:  Negative for depression. The patient is not nervous/anxious.       PHYSICAL EXAMINATION    Vitals:   06/04/23 0829  BP: 112/77  Pulse: 96  Resp: 18  Temp: 97.8 F (36.6 C)  SpO2: 100%    Physical Exam Constitutional:      Appearance: Normal appearance.  HENT:     Head: Normocephalic and atraumatic.  Eyes:     General: No scleral icterus. Neurological:     General: No focal deficit present.     Mental Status: She is alert.  Psychiatric:        Mood and Affect: Mood normal.        Behavior: Behavior normal.    Rest of physical exam deferred for counseling  ASSESSMENT and THERAPY PLAN:   Malignant neoplasm of upper-inner quadrant of right breast in female, estrogen receptor positive (HCC) 09/06/2022:Right lumpectomy: Grade 2 IDC 1.5 cm margins -0/7 lymph nodes negative, ER 95%, PR 100%, HER2 negative, Ki-67 10%  Oncotype Dx: Recurrence score 20 (6% risk of distant recurrence) 11/06/2022-12/04/2022: Adjuvant radiation  Treatment plan: Adjuvant antiestrogen therapy with tamoxifen 20 mg daily x 10 years  Vaginal dryness and recurrent UTIs I recommended the following Stop tamoxifen for 4 weeks Consider showering with an intimate cleanser instead of soap and  water Consider using vitamin E vaginal suppository 2-3 times a week Proceed with appointment with Dr. Jackalyn Lombard await her recommendations Restart tamoxifen at 10 mg daily for 4 weeks then 10 mg twice a day thereafter  I reviewed that the dryness is likely secondary to estrogen changes from  perimenopause and tamoxifen.  Changing antiestrogen therapy would mean ovarian suppression and an aromatase inhibitor which could make the vaginal dryness worse, and cause additional symptoms that the patient is not currently experiencing.  Verbalizes understanding of the above.  We will see her back in June or sooner if she needs any additional assistance with symptom management related to her tamoxifen.   All questions were answered. The patient knows to call the clinic with any problems, questions or concerns. We can certainly see the patient much sooner if necessary.  Total encounter time:30 minutes*in face-to-face visit time, chart review, lab review, care coordination, order entry, and documentation of the encounter time.    Lillard Anes, NP 06/04/23 9:48 AM Medical Oncology and Hematology [facility] [address] [place], [place] [address] Tel. [phone]    Fax. [phone]  *Total Encounter Time as defined by the Centers for Medicare and Medicaid Services includes, in addition to the face-to-face time of a patient visit (documented in the note above) non-face-to-face time: obtaining and reviewing outside history, ordering and reviewing medications, tests or procedures, care coordination (communications with other health care professionals or caregivers) and documentation in the medical record.

## 2023-06-04 NOTE — Assessment & Plan Note (Signed)
09/06/2022:Right lumpectomy: Grade 2 IDC 1.5 cm margins -0/7 lymph nodes negative, ER 95%, PR 100%, HER2 negative, Ki-67 10%  Oncotype Dx: Recurrence score 20 (6% risk of distant recurrence) 11/06/2022-12/04/2022: Adjuvant radiation  Treatment plan: Adjuvant antiestrogen therapy with tamoxifen 20 mg daily x 10 years  Vaginal dryness and recurrent UTIs I recommended the following Stop tamoxifen for 4 weeks Consider showering with an intimate cleanser instead of soap and water Consider using vitamin E vaginal suppository 2-3 times a week Proceed with appointment with Dr. Jackalyn Lombard await her recommendations Restart tamoxifen at 10 mg daily for 4 weeks then 10 mg twice a day thereafter  I reviewed that the dryness is likely secondary to estrogen changes from perimenopause and tamoxifen.  Changing antiestrogen therapy would mean ovarian suppression and an aromatase inhibitor which could make the vaginal dryness worse, and cause additional symptoms that the patient is not currently experiencing.  Verbalizes understanding of the above.  We will see her back in June or sooner if she needs any additional assistance with symptom management related to her tamoxifen.

## 2023-06-17 ENCOUNTER — Ambulatory Visit: Payer: 59

## 2023-06-18 ENCOUNTER — Telehealth: Payer: Self-pay | Admitting: *Deleted

## 2023-06-18 NOTE — Telephone Encounter (Signed)
 Received call from pt stating her PCP preformed a vaginal US  in November of 2024 which showed thickening of the endometrium and requesting referral to GYN.  RN advised pt that PCP will need to send the referral since they were the ones to perform the exam.  Pt verbalized understanding and also states she will have report faxed to our office for our record.

## 2023-06-24 ENCOUNTER — Ambulatory Visit: Payer: BC Managed Care – PPO

## 2023-07-01 ENCOUNTER — Ambulatory Visit: Payer: 59 | Attending: Surgery

## 2023-07-01 DIAGNOSIS — I890 Lymphedema, not elsewhere classified: Secondary | ICD-10-CM | POA: Diagnosis present

## 2023-07-01 DIAGNOSIS — R600 Localized edema: Secondary | ICD-10-CM | POA: Insufficient documentation

## 2023-07-01 DIAGNOSIS — Z9189 Other specified personal risk factors, not elsewhere classified: Secondary | ICD-10-CM | POA: Diagnosis present

## 2023-07-01 DIAGNOSIS — Z483 Aftercare following surgery for neoplasm: Secondary | ICD-10-CM | POA: Insufficient documentation

## 2023-07-01 DIAGNOSIS — C50211 Malignant neoplasm of upper-inner quadrant of right female breast: Secondary | ICD-10-CM | POA: Insufficient documentation

## 2023-07-01 DIAGNOSIS — N63 Unspecified lump in unspecified breast: Secondary | ICD-10-CM | POA: Diagnosis present

## 2023-07-01 DIAGNOSIS — Z170 Estrogen receptor positive status [ER+]: Secondary | ICD-10-CM | POA: Diagnosis present

## 2023-07-01 DIAGNOSIS — R293 Abnormal posture: Secondary | ICD-10-CM | POA: Insufficient documentation

## 2023-07-01 IMAGING — MG MM DIGITAL SCREENING BILAT W/ TOMO AND CAD
8 series · 8 of 24 positions shown · non-contrast
Comparison: Previous exam(s).

CLINICAL DATA: Screening.

EXAM:
DIGITAL SCREENING BILATERAL MAMMOGRAM WITH TOMOSYNTHESIS AND CAD
TECHNIQUE: Bilateral screening digital craniocaudal and mediolateral oblique
mammograms were obtained. Bilateral screening digital breast
tomosynthesis was performed. The images were evaluated with
computer-aided detection.

[R CC synth-2D]
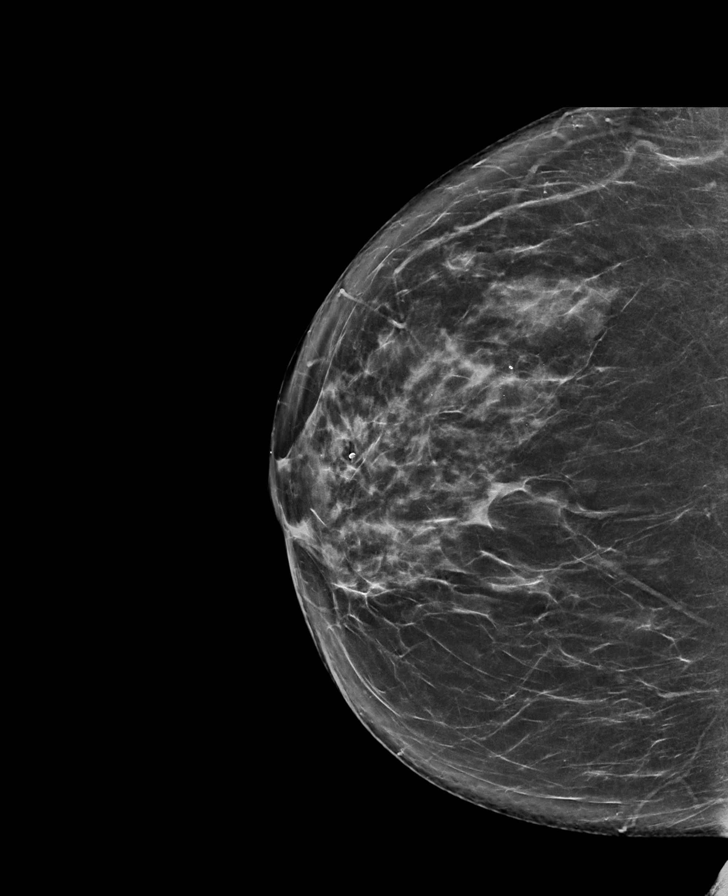

[L CC synth-2D]
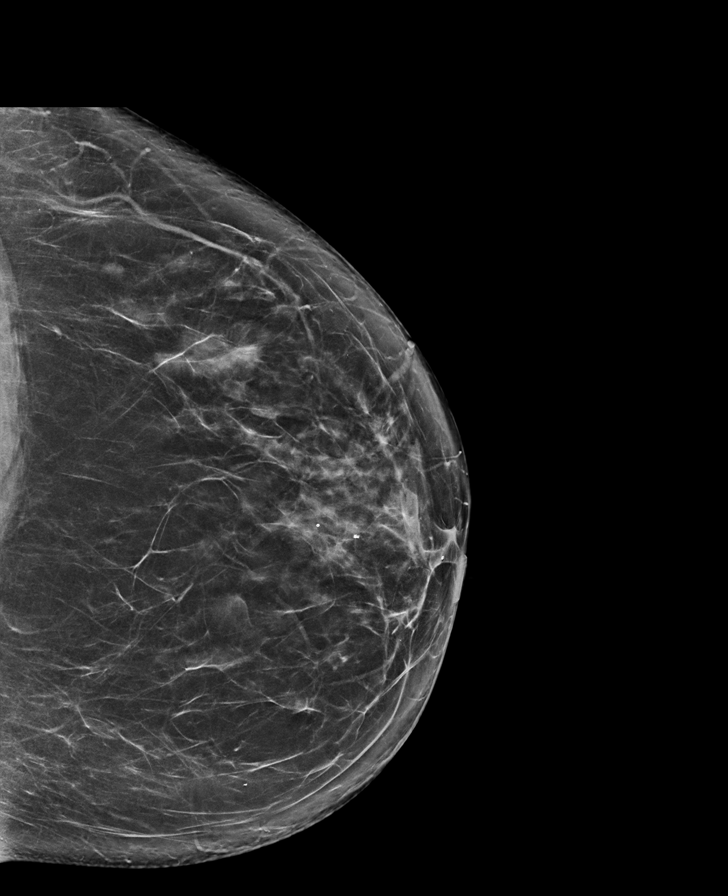

[R MLO synth-2D]
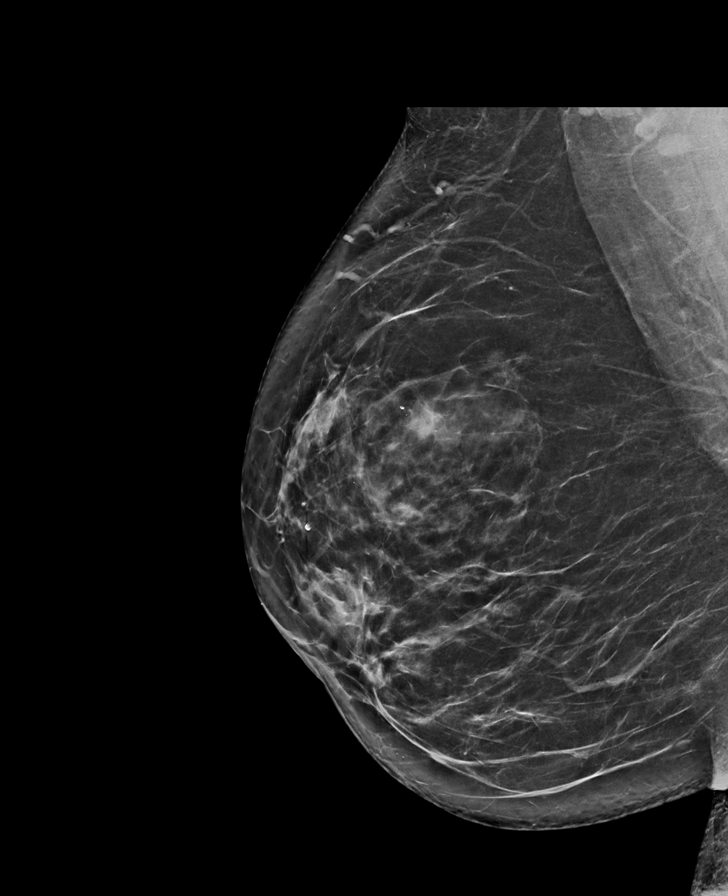

[L MLO synth-2D]
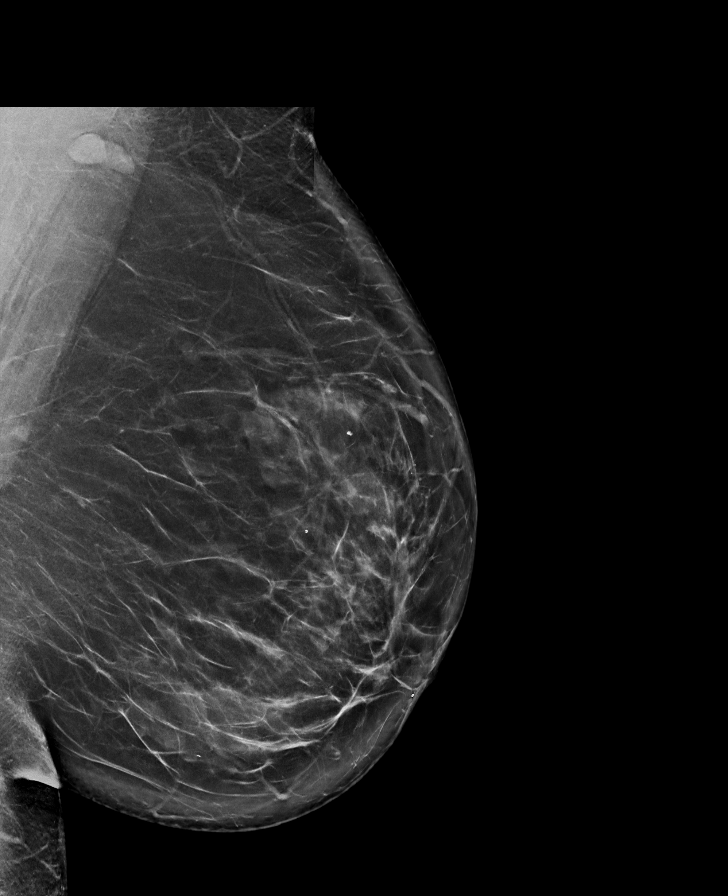

[L CC tomo · tomo slice 44/87.0]
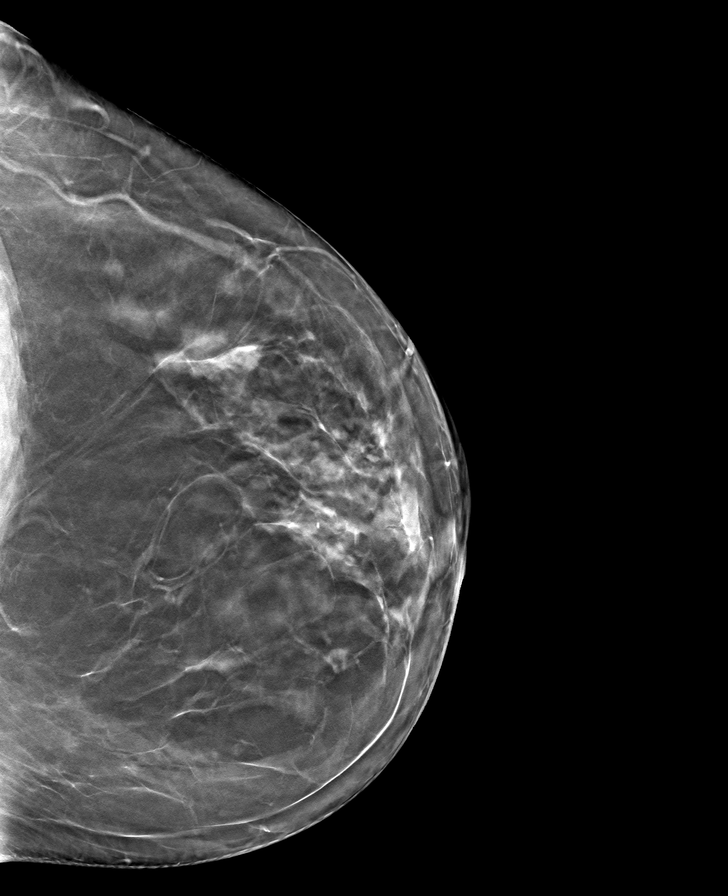

[R MLO tomo · tomo slice 44/87.0]
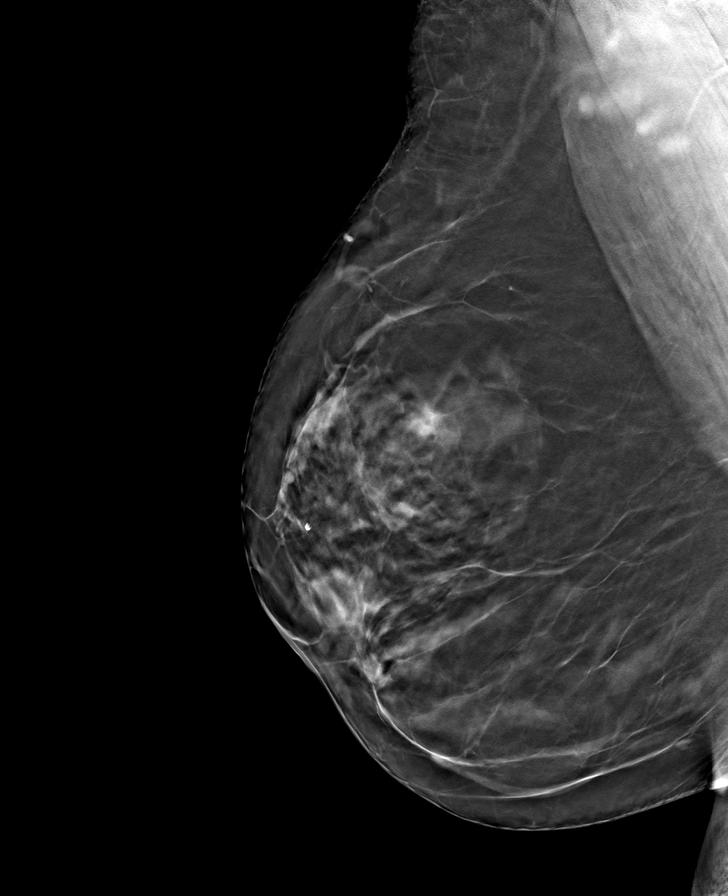

[R CC tomo · tomo slice 41/82.0]
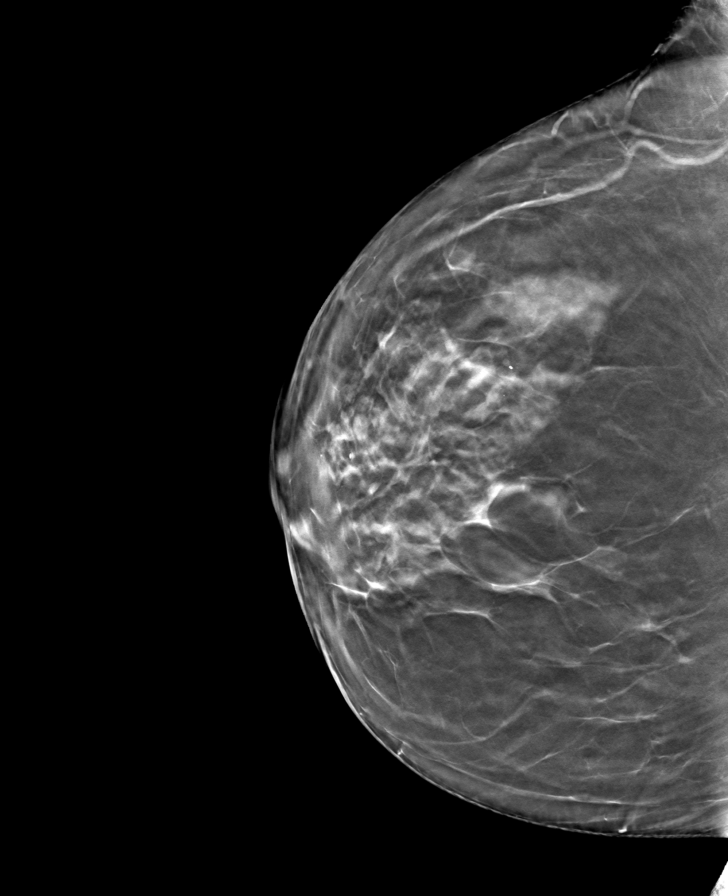

[L MLO tomo · tomo slice 49/97.0]
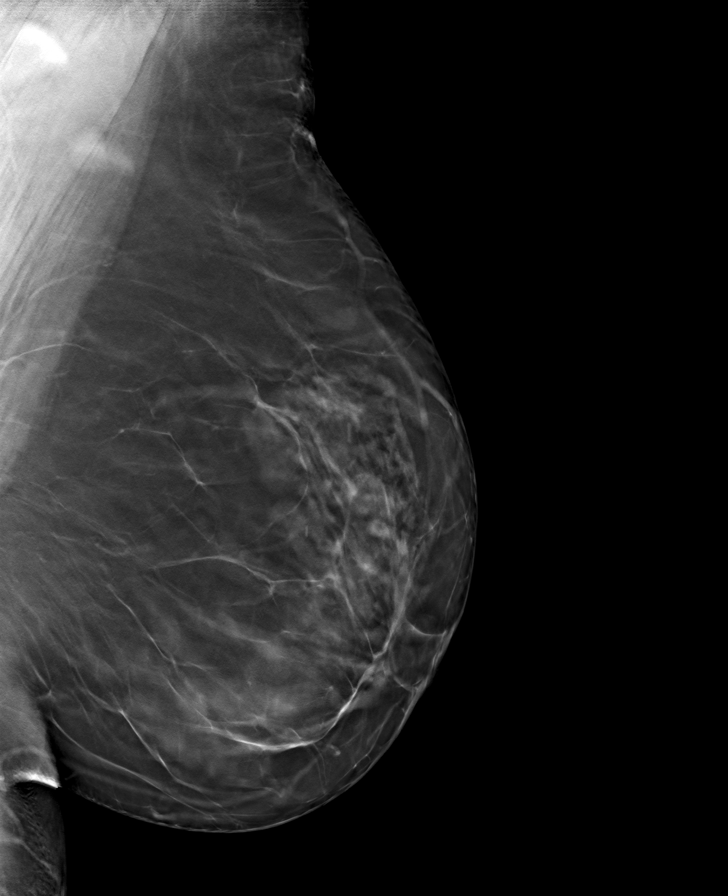

[8 of 24 positions shown; findings below may reference images not displayed]

ACR Breast Density Category c: The breast tissue is heterogeneously
dense, which may obscure small masses.
FINDINGS: There are no findings suspicious for malignancy.
IMPRESSION: No mammographic evidence of malignancy. A result letter of this
screening mammogram will be mailed directly to the patient.

RECOMMENDATION:
Screening mammogram in one year. (Code:Q3-W-BC3)

BI-RADS CATEGORY  1: Negative.

## 2023-07-01 NOTE — Therapy (Signed)
OUTPATIENT PHYSICAL THERAPY  UPPER EXTREMITY ONCOLOGY TREATMENT  Patient Name: Jennifer Kane MRN: [id number] DOB:Apr 12, 1976, 48 y.o., female Today's Date: 07/01/2023  END OF SESSION:  PT End of Session - 07/01/23 1602     Visit Number 7    Number of Visits 13    Date for PT Re-Evaluation 07/01/23    PT Start Time 1603    PT Stop Time 1656    PT Time Calculation (min) 53 min    Activity Tolerance Patient tolerated treatment well    Behavior During Therapy [facility] for tasks assessed/performed             Past Medical History:  Diagnosis Date   Anxiety    Asthma    Breast cancer (HCC) 08/07/2022   Diabetes (HCC)    Dysrhythmia    Hx of Irregular Heartbeat   Family history of brain cancer    Family history of breast cancer    Family history of ovarian cancer    GERD (gastroesophageal reflux disease)    History of panic attacks    History of tachycardia    Details not clear, reported abnormal tilt table test at age 26, on chronic low-dose beta-blocker   PONV (postoperative nausea and vomiting)    Past Surgical History:  Procedure Laterality Date   BREAST BIOPSY Right 08/07/2022   [place] RT BREAST BX W LOC DEV 1ST LESION IMG BX SPEC US GUIDE 08/07/2022 [facility]   BREAST BIOPSY  09/04/2022   [place] RT RADIOACTIVE SEED LOC 09/04/2022 [facility]   BREAST LUMPECTOMY WITH RADIOACTIVE SEED AND SENTINEL LYMPH NODE BIOPSY Right 09/06/2022   Procedure: RIGHT BREAST LUMPECTOMY WITH RADIOACTIVE SEED AND SENTINEL LYMPH NODE BIOPSY;  Surgeon: Abigail Miyamoto, MD;  Location: [facility] OR;  Service: General;  Laterality: Right;   CHOLECYSTECTOMY N/A 05/01/2018   Procedure: LAPAROSCOPIC CHOLECYSTECTOMY;  Surgeon: Rodman Pickle, MD;  Location: [facility];  Service: General;  Laterality: N/A;   KNEE ARTHROSCOPY  1989   No previous surgeries     WISDOM TOOTH EXTRACTION     Patient Active Problem List   Diagnosis Date Noted   Genetic testing 09/24/2022   Family history of breast  cancer 09/12/2022   Family history of ovarian cancer 09/12/2022   Family history of brain cancer 09/12/2022   Malignant neoplasm of upper-inner quadrant of right breast in female, estrogen receptor positive (HCC) 08/24/2022   Moderate persistent asthma, uncomplicated 01/31/2022   Seasonal and perennial allergic rhinitis 01/31/2022   Anaphylactic shock due to adverse food reaction 01/31/2022     REFERRING PROVIDER: Serena Croissant, MD  REFERRING DIAG: right breast Lymphedema s/p Lumpectomy  THERAPY DIAG:  Malignant neoplasm of upper-inner quadrant of right breast in female, estrogen receptor positive (HCC)  Aftercare following surgery for neoplasm  Localized edema  Breast swelling  At risk for lymphedema  Abnormal posture  Lymphedema, not elsewhere classified  ONSET DATE: 12/2022  Rationale for Evaluation and Treatment: Rehabilitation  SUBJECTIVE:  SUBJECTIVE STATEMENT:  I can tell my Rt breast is getting better overall.  My class shared their cooties with me so I have been sick. So far I have avoided the noro viruses. I see the urologist tomorrow.  I think all of the UTI's are from the Tamoxifen. I have been wearing the compression bra but sometimes I need to let my breasts breathe. I do the manual lymphatic drainage but not every day. Swelling is overall better, and the pain and tightness has been better since stopping the Tamoxifen also.  PERTINENT HISTORY:  Patient was diagnosed with right grade 2 IDC. It measures 1.1 cm. It is ER/PR positive with a Ki67 of 10%. Rt breast lumpectomy and SLNB on 09/06/22 with 7 negative nodes removed No benefit to chemotherapy and pt had radiation and antiestrogen therapy. Other hx includes asthma, DM, Right shoulder pain from cross fit  PAIN:  Are you having  pain? No  PRECAUTIONS: Right UE Lymphedema risk, DM, Asthma, prior injury to right shoulder doing crossfit  RED FLAGS: None   WEIGHT BEARING RESTRICTIONS: No  FALLS:  Has patient fallen in last 6 months? No  LIVING ENVIRONMENT: Lives with: lives with their spouse   OCCUPATION: kindergarten teacher   LEISURE: nothing right now  HAND DOMINANCE: right   PRIOR LEVEL OF FUNCTION: Independent  PATIENT GOALS: To decrease right breast swelling, discomfort   OBJECTIVE: Note: Objective measures were completed at Evaluation unless otherwise noted.  COGNITION: Overall cognitive status: Within functional limits for tasks assessed   PALPATION: Tender right pectorals, inferior axilla, lateral arm at mid delt  OBSERVATIONS / OTHER ASSESSMENTS: generalized right breast swelling and axillary swelling, mild peau d'orange inferior medial breast, New burn on right breast from spilling [place] oil on Thanksgiving, mild redness right breast. Fibrosis noted in axilla, and medial breast  SENSATION: Light touch: Deficits    POSTURE: Forward head, rounded shoulders  UPPER EXTREMITY AROM/PROM:  A/PROM RIGHT   eval   Shoulder extension 50, tight outer arm  Shoulder flexion 160 tight pecs  Shoulder abduction 170  Shoulder internal rotation 60 pain  Shoulder external rotation NT    (Blank rows = not tested)  A/PROM LEFT   eval  Shoulder extension 43  Shoulder flexion 167  Shoulder abduction 177  Shoulder internal rotation 60  Shoulder external rotation 100    (Blank rows = not tested)  CERVICAL AROM: All within functional limits:      UPPER EXTREMITY STRENGTH:   LYMPHEDEMA ASSESSMENTS:   SURGERY TYPE/DATE: Rt breast lumpectomy and SLNB on 09/06/22   NUMBER OF LYMPH NODES REMOVED: 0/7  CHEMOTHERAPY: No  RADIATION:YES, 11/06/2022-12/04/2022  HORMONE TREATMENT: YES, Tamoxifen  INFECTIONS: NO   LYMPHEDEMA ASSESSMENTS:   LANDMARK RIGHT  eval  At axilla  39.7  15 cm  proximal to olecranon process   10 cm proximal to olecranon process 36.5  Olecranon process 30.3  15 cm proximal to ulnar styloid process 30.5  10 cm proximal to ulnar styloid process 28.2  Just proximal to ulnar styloid process 19.7  Across hand at thumb web space 21.4  At base of 2nd digit 7.2  (Blank rows = not tested)  LANDMARK LEFT  eval  At axilla  39.9  15 cm proximal to olecranon process   10 cm proximal to olecranon process 36.3  Olecranon process 29.8  15 cm proximal to ulnar styloid process 28.8  10 cm proximal to ulnar styloid process 27.0  Just proximal to ulnar styloid process  19.8  Across hand at thumb web space 20.1  At base of 2nd digit 7  (Blank rows = not tested)   FUNCTIONAL TESTS:    GAIT: WNL  QUICK DASH SURVEY: 11.36  BREAST COMPLAINTS QUESTIONNAIRE(EVAL) Pain:4 Heaviness:3 Swollen feeling:3 Tense Skin:3 Redness:2 Bra Print:4 Size of Pores:1 Hard feeling: 5 Total:   25  /80 A Score over 9 indicates lymphedema issues in the breast  BREAST COMPLAINTS QUESTIONNAIRE( 05/29/2023) Pain:0 Heaviness:0 Swollen feeling:5 Tense Skin:2 Redness:0 Bra Print:10 Size of Pores:6 Hard feeling: 5 Total:    28 /80 A Score over 9 indicates lymphedema issues in the breast BREAST COMPLAINTS QUESTIONNAIRE(06/29/2023) Pain:0 Heaviness:1 Swollen feeling:1 Tense Skin:0 Redness:0 Bra Print:1 Size of Pores:0 Hard feeling: 1 Total:    4 /80 A Score over 9 indicates lymphedema issues in the breast    TODAY'S TREATMENT:                                                                                                                                          DATE:  07/01/2023 Discussed improvements/deficits and reviewed all goals with pt. for DC In supine: Short neck, superficial and deep abdominals, Lt axillary nodes, Lt intact anterior thorax sequence, and establishment of anterior inter-axillary pathway, Rt inguinal nodes and establishment of Rt  axillo-inguinal pathway, then Rt breast moving fluid towards pathways spending extra time in any areas of fibrosis and on medial breast then into Lt S/L for focus to lateral breast, then finished retracing all steps in supine and ending with LN's. Discussed Karena bra as a good option for her and she will look into getting some more compression. Performed 3 month SOZO: well WNL, and pt was scheduled for next SOZO in 3 months  06/03/23: Manual Therapy In supine: Short neck, superficial and deep abdominals, Lt axillary nodes, Lt intact anterior thorax sequence, and establishment of anterior inter-axillary pathway, Rt inguinal nodes and establishment of Rt axillo-inguinal pathway, then Rt breast moving fluid towards pathways spending extra time in any areas of fibrosis and on medial breast then into Lt S/L for focus to lateral breast, then finished retracing all steps in supine and ending with LN's. STM to Rt lateral trunk and axilla in supine at end P/ROMs of Rt shoulder, then into Lt S/L for focus to medial scap border where pt palpably tight.  P/ROM to Rt shoulder in supine into end motions of flex, abd and D2 with scap depression by therapist throughout  05/29/2023 Pt completed breast complaints survey and we discussed all goals for recert Lateral breast without enlarged pores, but enlarged pores noted medially. Some swelling noted in axillary region. Significant bra print on medial breast where compression bra flap was not in place. Fixed tonight so pt will see how it does. In supine: Short neck, superficial and deep abdominals,, L axillary nodes and establishment of interaxillary pathway, R inguinal nodes and establishment of  axilloinguinal pathway, then R breast moving fluid towards pathways spending extra time in any areas of fibrosis and on medial breast then retracing all steps and ending with LN's. Had pt don compression bra and showed how to pull Zipper flap across Gave flexi touch info to look  at.  05/21/23 In supine: Short neck, 5 diaphragmatic breaths, superficial and deep abdominals,  L axillary nodes and establishment of interaxillary pathway, R inguinal nodes and establishment of axilloinguinal pathway, then R breast moving fluid towards pathways spending extra time in any areas of fibrosis then retracing all steps and ending with LN's.  STM with cocoa butter to the Rt pectoralis PROM into flexion as tolerated  04/30/2023 Foam pad made for axillary border of bra to prevent bra digging in and causing more swelling in axilla Fibrosis noted right axillary area and lateral breast, and mildly at medial breast continued MLD to right breast while instructing pt and having her try all techniques. In supine: Short neck, 5 diaphragmatic breaths, L axillary nodes and establishment of interaxillary pathway, R inguinal nodes and establishment of axilloinguinal pathway, then R breast moving fluid towards pathways spending extra time in any areas of fibrosis then retracing all steps and ending with LN's. Showed fibrosis technique prior to MLD Therapist demonstrated and pt practiced all techniques.  04/22/2023 STM with cocoabutter to right UT, pectorals and lateral trunk Supine wand flexion x 3, scaption x 2 complained of shoulder pain so held Tried PROM but pt too guarded to allow me to perform PROM Initiated lower trunk rotations with knees left x 3 to stretch right pectorals. Initiated MLD to right breast while instructing pt and having her try all techniques. In supine: Short neck, 5 diaphragmatic breaths, L axillary nodes and establishment of interaxillary pathway, R inguinal nodes and establishment of axilloinguinal pathway, then R breast moving fluid towards pathways spending extra time in any areas of fibrosis then retracing all steps and ending with LN's Therapist demonstrated and pt practiced all. She required VC's and TC's initially, but improved with practice and used good pressure. She  had some difficulty getting her hand flat while doing the medial side of her breast. Gave pt written handout for MLD. Reminded to continue stretches to get pecs loosened up.  04/16/2023 Pt educated in supine stargazer stretch and abduction wall stretch to stretch tight muscles and to perform 2x/day holding 5-10 secs. Showed chart of lymphatics and explained the gentle nature of MLD that we will be doing for breast swelling due to superficial nature of lymphatics.    PATIENT EDUCATION:  Education details: Editor, commissioning, wall slides for abd, Role of lymphatics Person educated: Patient Education method: Explanation and Handouts Education comprehension: verbalized understanding and returned demonstration  HOME EXERCISE PROGRAM: Stargazer stretch, wall slide for abd  ASSESSMENT:  CLINICAL IMPRESSION: Pt has achieved all applicable goals established at initial evaluation. She is compliant with her compression bra most days, and is doing MLD most days but does report that she misses some days. Her swelling has improved but she does still have some mild fibrosis at the Right medial breast which does improve and soften with MLD. Her UQ pain has improved by atleast 50%, and swelling has improved atleast 50%. She feels independent with her self management and feels ready to be discharged.    OBJECTIVE IMPAIRMENTS: decreased activity tolerance, decreased knowledge of condition, decreased ROM, increased edema, impaired flexibility, impaired sensation, postural dysfunction, and pain.   ACTIVITY LIMITATIONS: lifting, sleeping, bed mobility, reach over head,  and hygiene/grooming  PARTICIPATION LIMITATIONS: cleaning and extremes of reaching  PERSONAL FACTORS: 1-2 comorbidities: Right breast Cancer s/p Lumpectomy and radiation  are also affecting patient's functional outcome.   REHAB POTENTIAL: Good  CLINICAL DECISION MAKING: Stable/uncomplicated  EVALUATION COMPLEXITY: Low  GOALS: Goals reviewed with  patient? Yes  SHORT TERM GOALS=LONG TERM GOALS: Target date: 05/27/2022  Pt will be independent with use of compression bra/sports bra to decrease swelling Baseline: Goal status: MET 05/29/2023 2.  Breast Complaints survey will be no greater than 10  to demonstrate improved swelling Baseline: 25 Goal status: MET 07/01/2023 3.  Pt will be independent in MLD to the right breast to decrease swelling. Baseline:  Goal status: MET 1/15/.2025 4.  Pt will have no complaints of pectoral/lateral trunk tightness with right shoulder ROM Baseline:  Goal status: MET 07/01/2023 5.  Pt will report decreased swelling by atleast 50% Baseline:  Goal status:MET 07/01/2023 6.  Pt will have greater than 50% improvement in Right UQ pain Baseline:  Goal status: MET 05/29/2023   PLAN:  PT FREQUENCY: 2x/week  PT DURATION: 3-4 weeks  PLANNED INTERVENTIONS: 16109- PT Re-evaluation, 97110-Therapeutic exercises, 97112- Neuromuscular re-education, 97535- Self Care, 60454- Manual therapy, [phone]- Orthotic Fit/training, Patient/Family education, Joint mobilization, Manual lymph drainage, Scar mobilization, Therapeutic exercises, Neuromuscular re-education, and Self Care  PLAN FOR NEXT SESSION: Pt is discharged to continue independent self management. She was advised to contact us with any questions or concerns. She will continue her 26month SOZO screens. PHYSICAL THERAPY DISCHARGE SUMMARY  Visits from Start of Care: 7  Current functional level related to goals / functional outcomes: Achieved all goals   Remaining deficits: Mild medial breast fibrosis   Education / Equipment: HEP, MLD, compression bras   Patient agrees to discharge. Patient goals were met. Patient is being discharged due to meeting the stated rehab goals.and being pleased with her progress.   Waynette Buttery, PT 07/01/2023, 5:19 PM

## 2023-07-29 ENCOUNTER — Ambulatory Visit
Admission: RE | Admit: 2023-07-29 | Discharge: 2023-07-29 | Disposition: A | Discharge status: Home / Self Care | Payer: BC Managed Care – PPO | Source: Ambulatory Visit | Attending: Adult Health | Admitting: Adult Health

## 2023-07-29 DIAGNOSIS — C50211 Malignant neoplasm of upper-inner quadrant of right female breast: Secondary | ICD-10-CM

## 2023-10-28 ENCOUNTER — Inpatient Hospital Stay: Payer: BC Managed Care – PPO | Admitting: Hematology and Oncology

## 2023-10-28 NOTE — Assessment & Plan Note (Deleted)
 09/06/2022:Right lumpectomy: Grade 2 IDC 1.5 cm margins -0/7 lymph nodes negative, ER 95%, PR 100%, HER2 negative, Ki-67 10%  Oncotype Dx: Recurrence score 20 (6% risk of distant recurrence) 11/06/2022-12/04/2022: Adjuvant radiation   Treatment plan: Adjuvant antiestrogen therapy with tamoxifen  20 mg daily x 10 years (plan to switch to aromatase inhibitors after menopause) Tamoxifen  toxicities:  Breast cancer surveillance: Breast exam 10/28/2023: Benign Mammogram 07/29/2023: Benign breast density category C  Return to clinic in 1 year for follow up

## 2023-11-03 NOTE — Assessment & Plan Note (Signed)
 09/06/2022:Right lumpectomy: Grade 2 IDC 1.5 cm margins -0/7 lymph nodes negative, ER 95%, PR 100%, HER2 negative, Ki-67 10%  Oncotype Dx: Recurrence score 20 (6% risk of distant recurrence) 11/06/2022-12/04/2022: Adjuvant radiation   Treatment plan: Adjuvant antiestrogen therapy with tamoxifen  20 mg daily x 10 years Tamoxifen  Toxicities: Vaginal Dryness:   RTC in 1 year

## 2023-11-04 ENCOUNTER — Inpatient Hospital Stay: Attending: Hematology and Oncology | Admitting: Hematology and Oncology

## 2023-11-04 VITALS — BP 110/84 | HR 73 | Temp 98.7°F | Resp 18 | Ht 63.0 in | Wt 234.2 lb

## 2023-11-04 DIAGNOSIS — C50211 Malignant neoplasm of upper-inner quadrant of right female breast: Secondary | ICD-10-CM | POA: Diagnosis present

## 2023-11-04 DIAGNOSIS — Z170 Estrogen receptor positive status [ER+]: Secondary | ICD-10-CM | POA: Insufficient documentation

## 2023-11-04 NOTE — Progress Notes (Signed)
 Patient Care Team: Skillman, Katherine E, PA-C as PCP - General (Physician Assistant) Kayla Drivers, MD (Family Medicine) Odean Potts, MD as Consulting Physician (Hematology and Oncology) Vernetta Berg, MD as Consulting Physician (General Surgery) Dewey Rush, MD as Consulting Physician (Radiation Oncology)  DIAGNOSIS:  Encounter Diagnosis  Name Primary?   Malignant neoplasm of upper-inner quadrant of right breast in female, estrogen receptor positive (HCC) Yes    SUMMARY OF ONCOLOGIC HISTORY: Oncology History  Malignant neoplasm of upper-inner quadrant of right breast in female, estrogen receptor positive (HCC)  08/07/2022 Initial Diagnosis   Mammogram detected suspicious mass in the right breast 12:30 position 1.1 cm (no evidence of malignancy in the area of concern in the LIQ) biopsy: Grade 2 IDC ER 95%, PR 100%, Ki67 10%, HER2 2+ by IHC negative by FISH ratio 1.09, copy #1.8   09/04/2022 Cancer Staging   Staging form: Breast, AJCC 8th Edition - Clinical: Stage IA (cT1c, cN0, cM0, G2, ER+, PR+, HER2-) - Signed by Odean Potts, MD on 09/04/2022 Stage prefix: Initial diagnosis Histologic grading system: 3 grade system   09/06/2022 Surgery   Right lumpectomy: Grade 2 IDC 1.5 cm margins -0/7 lymph nodes negative, ER 95%, PR 100%, HER2 negative, Ki-67 10%   09/18/2022 Oncotype testing   Oncotype DX recurrence score 20 (risk of distant) and 9 years: 6%) no benefit of chemo   09/23/2022 Genetic Testing   Negative genetic testing on the Multi-cancer+RNAinsight panel.  The report date is Sep 23, 2022.  The Multi-Cancer + RNA Panel offered by [facility] includes sequencing and/or deletion/duplication analysis of the following 70 genes:  AIP*, ALK, APC*, ATM*, AXIN2*, BAP1*, BARD1*, BLM*, BMPR1A*, BRCA1*, BRCA2*, BRIP1*, CDC73*, CDH1*, CDK4, CDKN1B*, CDKN2A, CHEK2*, CTNNA1*, DICER1*, EPCAM (del/dup only), EGFR, FH*, FLCN*, GREM1 (promoter dup only), HOXB13, KIT, LZTR1, MAX*, MBD4, MEN1*,  MET, MITF, MLH1*, MSH2*, MSH3*, MSH6*, MUTYH*, NF1*, NF2*, NTHL1*, PALB2*, PDGFRA, PMS2*, POLD1*, POLE*, POT1*, PRKAR1A*, PTCH1*, PTEN*, RAD51C*, RAD51D*, RB1*, RET, SDHA* (sequencing only), SDHAF2*, SDHB*, SDHC*, SDHD*, SMAD4*, SMARCA4*, SMARCB1*, SMARCE1*, STK11*, SUFU*, TMEM127*, TP53*, TSC1*, TSC2*, VHL*. RNA analysis is performed for * genes.   11/06/2022 - 12/04/2022 Radiation Therapy   Plan Name: Breast_R Site: Breast, Right Technique: 3D Mode: Photon Dose Per Fraction: 1.8 Gy Prescribed Dose (Delivered / Prescribed): 1.8 Gy / 50.4 Gy Prescribed Fxs (Delivered / Prescribed): 1 / 28   Plan Name: Breast_R_Bst1 Site: Breast, Right Technique: 3D Mode: Photon Dose Per Fraction: 2.66 Gy Prescribed Dose (Delivered / Prescribed): 39.9 Gy / 39.9 Gy Prescribed Fxs (Delivered / Prescribed): 15 / 15   Plan Name: Breast_R_Bst2 Site: Breast, Right Technique: 3D Mode: Photon Dose Per Fraction: 2.5 Gy Prescribed Dose (Delivered / Prescribed): 10 Gy / 10 Gy Prescribed Fxs (Delivered / Prescribed): 4 / 4   12/2022 -  Anti-estrogen oral therapy   Tamoxifen      CHIEF COMPLIANT: Follow-up on tamoxifen  therapy  HISTORY OF PRESENT ILLNESS:   History of Present Illness Jennifer Kane is a 48 year old female who presents with symptoms suggestive of a urinary tract infection and concerns related to tamoxifen  use.  She experiences frequent urination and discomfort, but recent tests show no urinary tract infection. Pelvic pain and discomfort are present. Tamoxifen  use has led to lighter, less frequent menstrual cycles, significant hot flashes, and vaginal dryness. She is scheduled to see a gynecologist for these symptoms. She has a history of uterine polyp removal in May. She has experienced a recent lymphedema flare-up with redness, itching, and pain,  managed with home massages and a compression bra.     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  MEDICATIONS:  Current Outpatient  Medications  Medication Sig Dispense Refill   EPINEPHrine  (EPIPEN  2-PAK) 0.3 mg/0.3 mL IJ SOAJ injection Inject 0.3 mg into the muscle once.     escitalopram (LEXAPRO) 10 MG tablet Take 5 mg by mouth at bedtime.     ibuprofen  (ADVIL ) 200 MG tablet Take 200 mg by mouth every 6 (six) hours as needed for moderate pain.     trimethoprim (TRIMPEX) 100 MG tablet Take 100 mg by mouth daily.     trimethoprim (TRIMPEX) 100 MG tablet Take 100 mg by mouth 2 (two) times daily.     ALPRAZolam (XANAX) 0.25 MG tablet Take by mouth. (Patient not taking: Reported on 04/30/2023)     cetirizine (ZYRTEC) 5 MG chewable tablet Chew 5 mg by mouth daily. (Patient not taking: Reported on 04/03/2023)     Semaglutide,0.25 or 0.5MG /DOS, (OZEMPIC, 0.25 OR 0.5 MG/DOSE,) 2 MG/1.5ML SOPN Inject 0.5 mg into the skin once a week. (Patient not taking: Reported on 11/04/2023)     No current facility-administered medications for this visit.    PHYSICAL EXAMINATION: ECOG PERFORMANCE STATUS: 1 - Symptomatic but completely ambulatory  Vitals:   11/04/23 0957  BP: 110/84  Pulse: 73  Resp: 18  Temp: 98.7 F (37.1 C)  SpO2: 99%   Filed Weights   11/04/23 0957  Weight: 234 lb 3.2 oz (106.2 kg)      LABORATORY DATA:  I have reviewed the data as listed    Latest Ref Rng & Units 09/12/2022   10:26 AM 08/30/2022    2:57 PM 04/28/2018    9:40 AM  CMP  Glucose 70 - 99 mg/dL 859  877  99   BUN 6 - 20 mg/dL 11  11  10    Creatinine 0.44 - 1.00 mg/dL 9.31  9.21  9.16   Sodium 135 - 145 mmol/L 137  136  139   Potassium 3.5 - 5.1 mmol/L 4.1  3.6  4.1   Chloride 98 - 111 mmol/L 105  103  107   CO2 22 - 32 mmol/L 29  26  25    Calcium 8.9 - 10.3 mg/dL 8.8  8.7  8.9   Total Protein 6.5 - 8.1 g/dL 7.1     Total Bilirubin 0.3 - 1.2 mg/dL 0.4     Alkaline Phos 38 - 126 U/L 69     AST 15 - 41 U/L 25     ALT 0 - 44 U/L 39       Lab Results  Component Value Date   WBC 8.1 09/12/2022   HGB 13.8 09/12/2022   HCT 40.6 09/12/2022    MCV 94.6 09/12/2022   PLT 298 09/12/2022   NEUTROABS 5.0 09/12/2022    ASSESSMENT & PLAN:  Malignant neoplasm of upper-inner quadrant of right breast in female, estrogen receptor positive (HCC) 09/06/2022:Right lumpectomy: Grade 2 IDC 1.5 cm margins -0/7 lymph nodes negative, ER 95%, PR 100%, HER2 negative, Ki-67 10%  Oncotype Dx: Recurrence score 20 (6% risk of distant recurrence) 11/06/2022-12/04/2022: Adjuvant radiation   Treatment plan: Adjuvant antiestrogen therapy with tamoxifen  20 mg daily discontinued June 2025 Tamoxifen  Toxicities: Vaginal Dryness:  Recurrent symptoms related to UTI Generalized fatigue, hot flashes Based on the above symptoms she discontinued tamoxifen . If any year she becomes menopausal we will consider providing her anastrozole therapy.  RTC in 1 year  ------------------------------------- Assessment  and Plan Assessment & Plan Vaginal dryness Symptoms potentially exacerbated by Tamoxifen . No confirmed UTI. Gynecological evaluation scheduled. Decision made to discontinue Tamoxifen  to assess symptom improvement. Menopause consideration one year post last menstrual cycle for alternative treatments. - Discontinue Tamoxifen  to assess symptom improvement. - Follow up with gynecologist for further evaluation. - Consider alternative treatments post-menopause if symptoms persist.  Lymphedema Intermittent flare-ups managed with home massages and compression bra, effectively reducing symptoms. - Continue home massages and wearing compression bra as needed. - Schedule a one-year follow-up to reassess management.      No orders of the defined types were placed in this encounter.  The patient has a good understanding of the overall plan. she agrees with it. she will call with any problems that may develop before the next visit here. Total time spent: 30 mins including face to face time and time spent for planning, charting and co-ordination of care   Viinay K  Olamae Ferrara, MD 11/04/23

## 2023-12-05 ENCOUNTER — Other Ambulatory Visit: Payer: Self-pay | Admitting: Hematology and Oncology

## 2024-03-09 ENCOUNTER — Ambulatory Visit: Admitting: Family Medicine

## 2024-03-09 ENCOUNTER — Encounter: Payer: Self-pay | Admitting: Family Medicine

## 2024-03-09 ENCOUNTER — Other Ambulatory Visit: Payer: Self-pay

## 2024-03-09 VITALS — BP 122/84 | Ht 63.0 in | Wt 235.0 lb

## 2024-03-09 DIAGNOSIS — M7531 Calcific tendinitis of right shoulder: Secondary | ICD-10-CM

## 2024-03-09 DIAGNOSIS — M7541 Impingement syndrome of right shoulder: Secondary | ICD-10-CM | POA: Diagnosis not present

## 2024-03-09 DIAGNOSIS — C50211 Malignant neoplasm of upper-inner quadrant of right female breast: Secondary | ICD-10-CM | POA: Diagnosis not present

## 2024-03-09 DIAGNOSIS — Z170 Estrogen receptor positive status [ER+]: Secondary | ICD-10-CM

## 2024-03-09 DIAGNOSIS — M25511 Pain in right shoulder: Secondary | ICD-10-CM | POA: Diagnosis not present

## 2024-03-09 DIAGNOSIS — G8929 Other chronic pain: Secondary | ICD-10-CM | POA: Diagnosis not present

## 2024-03-09 MED ORDER — METHYLPREDNISOLONE ACETATE 40 MG/ML IJ SUSP
40.0000 mg | Freq: Once | INTRAMUSCULAR | Status: AC
  Administered 2024-03-09: 40 mg via INTRA_ARTICULAR

## 2024-03-09 NOTE — Patient Instructions (Signed)

## 2024-03-09 NOTE — Progress Notes (Signed)
 DATE OF VISIT: 03/09/2024        Jennifer Kane DOB: 23-May-1975 MRN: [id number]  Discussed the use of AI scribe software for clinical note transcription with the patient, who gave verbal consent to proceed.  History of Present Illness Jennifer Kane is a 48 year old female who presents with right shoulder pain for evaluation and possible injection. PMH significant for right-sided breast cancer s/p lumpectomy and radiation therapy [facility] Referred by chiropractor Dr Venetia Ellen  Right shoulder pain - Duration of pain for over a year, more noticeable over the last 6 months with progressive worsening and significant exacerbation in the last few weeks - Pain is stabbing and shooting in character - Pain is primarily located on the lateral aspect of the right shoulder - Radiation of pain down to the elbow, without extension into the forearm or fingers - Pain is constant and interferes with sleep - Exacerbated by lifting heavy objects, reaching overhead, and washing hair - Adaptation by avoiding certain movements and using the left arm to assist with tasks such as undressing  Prior treatments for shoulder pain - Chiropractic therapy for over a year, with two visits specifically for this shoulder issue this year - Use of TENS unit provides temporary relief during use, but pain returns after removal - Topical medication applications with limited relief - Occasional use of Advil , which provides some relief, but preference to avoid regular medication use - No imaging studies performed  Paresthesia of upper extremities - Recent increase in tingling in the hands    Medications:  Outpatient Encounter Medications as of 03/09/2024  Medication Sig   rosuvastatin (CRESTOR) 5 MG tablet Take 5 mg by mouth daily.   ALPRAZolam (XANAX) 0.25 MG tablet Take by mouth. (Patient not taking: Reported on 04/30/2023)   cetirizine (ZYRTEC) 5 MG chewable tablet Chew 5 mg by mouth daily. (Patient not taking:  Reported on 04/03/2023)   EPINEPHrine  (EPIPEN  2-PAK) 0.3 mg/0.3 mL IJ SOAJ injection Inject 0.3 mg into the muscle once.   escitalopram (LEXAPRO) 10 MG tablet Take 5 mg by mouth at bedtime.   ibuprofen  (ADVIL ) 200 MG tablet Take 200 mg by mouth every 6 (six) hours as needed for moderate pain.   Semaglutide,0.25 or 0.5MG /DOS, (OZEMPIC, 0.25 OR 0.5 MG/DOSE,) 2 MG/1.5ML SOPN Inject 0.5 mg into the skin once a week. (Patient not taking: Reported on 11/04/2023)   trimethoprim (TRIMPEX) 100 MG tablet Take 100 mg by mouth daily.   trimethoprim (TRIMPEX) 100 MG tablet Take 100 mg by mouth 2 (two) times daily.   [EXPIRED] methylPREDNISolone acetate (DEPO-MEDROL) injection 40 mg    No facility-administered encounter medications on file as of 03/09/2024.    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  Physical Examination: Vitals: BP 122/84   Ht 5' 3 (1.6 m)   Wt 235 lb (106.6 kg)   BMI 41.63 kg/m  GENERAL:  Jennifer Kane is a 48 y.o. female appearing their stated age, alert and oriented x 3, in no apparent distress.  SKIN: no rashes or lesions, skin clean, dry, intact MSK: C-spine: No gross abnormalities.  No midline or paraspinal tenderness.  Negative Spurling's bilaterally Shoulder: Right shoulder without any gross deformity.  Tender palpation over the bicipital groove and greater tuberosity.  Full range of motion with positive painful arc.  Positive empty can, positive Hawkins, positive Neer, negative drop arm.  Rotator cuff strength 4/5 with forward flexion, abduction, external rotation, 5/5 with internal rotation. Left shoulder with full range of  motion without pain, weakness, instability NEURO: sensation intact to light touch, DTR 2/4 bicep, tricep, brachial radialis bilaterally VASC: pulses 2+ and symmetric radial artery bilaterally, no edema  Radiology: MSK ultrasound right shoulder Date: 03/09/2024 Indication: Right shoulder pain Findings: - Biceps tendon noted within the bicipital  groove.  Some hypoechoic changes appreciated, no signs of tearing.  Small amount of increased anechoic fluid in the biceps tendon sheath proximally.  Seen well in long and short axis views - Normal pectoralis attachment on the proximal humerus - Subscapularis with hypoechoic change along the insertional footplate.  Small calcification at the insertional footplate.  No definitive tears, possible small partial tear near insertional footplate.  Well-visualized in short and long axis views - No dynamic internal impingement on the coracoid - AC joint with moderate degenerative changes and positive geyser sign - Subacromial space with increased fluid in the subacromial bursa.  Calcifications noted within the supraspinatus and dynamic subacromial impingement is noted with calcification impinging on the acromion with dynamic testing. - Supraspinatus with large hyperechoic calcification near the insertional footplate.  Largest size approximately 9 mm.  Some associated acoustic shadowing precludes ability to completely evaluate supraspinatus in that area.  Does have some partial intrasubstance tearing.  No full-thickness tearing appreciated. - Infraspinatus and teres minor with normal appearance.  Small calcification noted at the insertional footplate of the infraspinatus - Normal-appearing posterior glenohumeral joint  Impression: - Proximal long head biceps tendinopathy - Subscapularis tendinopathy - Calcific tendinosis of supraspinatus - Subacromial impingement - Moderate AC joint osteoarthritis Images and interpretation completed by Ozell Provencal, MD PGY3 and Rainell Cedar, DO    Assessment & Plan Acute on chronic right shoulder pain with MSK ultrasound showing calcific tendinopathy of the supraspinatus, subscapularis tendinopathy, biceps tendinopathy, subacromial impingement Limited relief from chiropractic therapy, TENS, and topical treatments. Discussed potential medication-induced exacerbation. -  right shoulder ultrasound completed as noted above to assess rotator cuff.  Findings as noted above and reviewed with patient today - Based on ultrasound findings, is a candidate for trial of subacromial cortisone injection to see if we can improve her symptoms.  This was completed today under ultrasound guidance as noted below - She can continue oral NSAIDs as needed - Can continue chiropractic therapy as she is doing - Will obtain shoulder x-ray to further evaluate calcification.  Could consider further advanced imaging with MRI in the future pending response to cortisone injection - Could consider trial of shockwave therapy or MRI of the shoulder in the future if not improving after the cortisone injection - Will follow-up in 2-3 weeks to assess her progress.  Encouraged to reach out sooner with any questions or concerns -She had concerns about her cholesterol medicine potentially contributing to her shoulder pain.  She will discuss this with her PCP, but based on ultrasound findings do not think this is likely contributing.   PROCEDURE:  Risks & benefits of right shoulder ultrasound-guided subacromial injection reviewed. Consent obtained. Time-out completed. Patient prepped and draped in the normal fashion. Area cleansed with chlorhexidine .  MSK ultrasound used to identify subacromial space.  Noted to have increased fluid in the subacromial space.  Ethyl chloride spray used to anesthetize the skin. Solution of 4 mL 1% lidocaine  with 1 mL methylprednisolone (Depo-medrol) 40mg /mL injected into the right subacromial space using a 25-gauge 1.5-inch needle via the lateral approach under ultrasound guidance.  Needle position within the subacromial space was confirmed.. Patient tolerated procedure well without any complications. Area covered with adhesive bandage. Post-procedure  care reviewed. All questions answered. Procedure completed by Dr. Ozell Provencal, MD PGY3 with assistance of myself.    History  of right sided breast cancer status postlumpectomy and chemotherapy - It is possible that radiation treatment could be contributing to her shoulder symptoms - Should continue to follow-up with oncologist and PCP as scheduled    Patient expressed understanding & agreement with above.  Encounter Diagnoses  Name Primary?   Chronic right shoulder pain Yes   Calcific tendonitis of right shoulder    Subacromial impingement of right shoulder    Malignant neoplasm of upper-inner quadrant of right breast in female, estrogen receptor positive (HCC)     Orders Placed This Encounter  Procedures   US  COMPLETE JOINT SPACE STRUCTURES UP RIGHT   DG Shoulder Right     VISIT SUMMARY: Today, you were seen for right shoulder pain that has been worsening over the past six months. You have tried various treatments, including chiropractic therapy, a TENS unit, and topical applications, with limited relief. You also mentioned recent tingling in your hands.  YOUR PLAN: -RIGHT SHOULDER PAIN: Your shoulder pain may involve the rotator cuff, which is a group of muscles and tendons that stabilize the shoulder. We will order an ultrasound to check for any tears. If there is no large tear, we may consider a cortisone injection to help reduce the pain. Additionally, we discussed the possibility that your cholesterol medication might be contributing to your symptoms, and you should talk to your primary care provider about this.  INSTRUCTIONS: Please schedule an ultrasound for your right shoulder. Follow up with your primary care provider to discuss the potential impact of your cholesterol medication on your symptoms. Contains text generated by [employer].

## 2024-03-18 ENCOUNTER — Ambulatory Visit
Admission: RE | Admit: 2024-03-18 | Discharge: 2024-03-18 | Disposition: A | Source: Ambulatory Visit | Attending: Family Medicine | Admitting: Family Medicine

## 2024-03-18 DIAGNOSIS — G8929 Other chronic pain: Secondary | ICD-10-CM

## 2024-03-24 ENCOUNTER — Ambulatory Visit: Payer: Self-pay | Admitting: Family Medicine

## 2024-03-24 NOTE — Progress Notes (Signed)
 X-rays reviewed.  MyChart message sent.  Plan to review in further detail at her follow-up visit with me next week

## 2024-04-02 ENCOUNTER — Ambulatory Visit (INDEPENDENT_AMBULATORY_CARE_PROVIDER_SITE_OTHER): Admitting: Family Medicine

## 2024-04-02 ENCOUNTER — Encounter: Payer: Self-pay | Admitting: Family Medicine

## 2024-04-02 ENCOUNTER — Other Ambulatory Visit: Payer: Self-pay | Admitting: *Deleted

## 2024-04-02 VITALS — BP 111/66 | Ht 63.0 in | Wt 235.0 lb

## 2024-04-02 DIAGNOSIS — C50211 Malignant neoplasm of upper-inner quadrant of right female breast: Secondary | ICD-10-CM

## 2024-04-02 DIAGNOSIS — M7531 Calcific tendinitis of right shoulder: Secondary | ICD-10-CM

## 2024-04-02 DIAGNOSIS — M7541 Impingement syndrome of right shoulder: Secondary | ICD-10-CM | POA: Diagnosis not present

## 2024-04-02 NOTE — Progress Notes (Signed)
 Received call from pt stating she has a calcium deposit in her right shoulder that needs shock therapy and pt concerned for lymphedema. Pt also states if shock therapy does not work, she will possibly need surgery.  Per MD okay to proceed with shock therapy but pt will need lymphedema eval prior and during therapy.  MD also states if pt has to proceed with surgery, surgeon may need to prescribe prophylactic antibiotics to prevent infection due to pt having hx of right lumpectomy and 7 lymph nodes removed. Pt educated and verbalized understanding.

## 2024-04-02 NOTE — Progress Notes (Signed)
 PCP: Skillman, Katherine E, PA-C  Patient is a 48 y.o. female here for follow up of chronic right shoulder pain from calcific tendinosis.  HPI Shoulder pain is mildly improved after steroid injection at last visit. She is able to sleep through the night without awakening from pain. She still has somewhat limited ROM - cannot lift arm above shoulder level. If she lifts right arm too high or too quickly she has burning, tingling pain that radiates partway down the arm. 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  Past Medical History:  Diagnosis Date   Anxiety    Asthma    Breast cancer (HCC) 08/07/2022   Diabetes (HCC)    Dysrhythmia    Hx of Irregular Heartbeat   Family history of brain cancer    Family history of breast cancer    Family history of ovarian cancer    GERD (gastroesophageal reflux disease)    History of panic attacks    History of tachycardia    Details not clear, reported abnormal tilt table test at age 89, on chronic low-dose beta-blocker   PONV (postoperative nausea and vomiting)     Current Outpatient Medications on File Prior to Visit  Medication Sig Dispense Refill   ALPRAZolam (XANAX) 0.25 MG tablet Take by mouth. (Patient not taking: Reported on 04/30/2023)     cetirizine (ZYRTEC) 5 MG chewable tablet Chew 5 mg by mouth daily. (Patient not taking: Reported on 04/03/2023)     EPINEPHrine  (EPIPEN  2-PAK) 0.3 mg/0.3 mL IJ SOAJ injection Inject 0.3 mg into the muscle once.     escitalopram (LEXAPRO) 10 MG tablet Take 5 mg by mouth at bedtime.     ibuprofen  (ADVIL ) 200 MG tablet Take 200 mg by mouth every 6 (six) hours as needed for moderate pain.     rosuvastatin (CRESTOR) 5 MG tablet Take 5 mg by mouth daily.     Semaglutide,0.25 or 0.5MG /DOS, (OZEMPIC, 0.25 OR 0.5 MG/DOSE,) 2 MG/1.5ML SOPN Inject 0.5 mg into the skin once a week. (Patient not taking: Reported on 11/04/2023)      trimethoprim (TRIMPEX) 100 MG tablet Take 100 mg by mouth daily.     trimethoprim (TRIMPEX) 100 MG tablet Take 100 mg by mouth 2 (two) times daily.     No current facility-administered medications on file prior to visit.    Past Surgical History:  Procedure Laterality Date   BREAST BIOPSY Right 08/07/2022   US  RT BREAST BX W LOC DEV 1ST LESION IMG BX SPEC US  GUIDE 08/07/2022 [facility]   BREAST BIOPSY  09/04/2022   US  RT RADIOACTIVE SEED LOC 09/04/2022 [facility]   BREAST LUMPECTOMY WITH RADIOACTIVE SEED AND SENTINEL LYMPH NODE BIOPSY Right 09/06/2022   Procedure: RIGHT BREAST LUMPECTOMY WITH RADIOACTIVE SEED AND SENTINEL LYMPH NODE BIOPSY;  Surgeon: Vernetta Berg, MD;  Location: [facility];  Service: General;  Laterality: Right;   CHOLECYSTECTOMY N/A 05/01/2018   Procedure: LAPAROSCOPIC CHOLECYSTECTOMY;  Surgeon: Stevie Herlene Righter, MD;  Location: [facility];  Service: General;  Laterality: N/A;   KNEE ARTHROSCOPY  1989   No previous surgeries     WISDOM TOOTH EXTRACTION      Allergies  Allergen Reactions   Meat [Alpha-Gal]     Unknown reaction     BP 111/66   Ht 5' 3 (1.6 m)   Wt 235 lb (106.6 kg)   BMI 41.63 kg/m  No data to display              No data to display              Objective:  Physical Exam: Gen: NAD, comfortable in exam room  Right Shoulder Exam No gross deformity, ecchymoses. No tenderness on palpation. Near full range of motion with positive painful arc  Normal rotator cuff strength today NVI distally.  Assessment and Plan:  Acute on chronic right shoulder pain with calcific tendinosis within the supraspinatus with associated subacromial impingement - Calcifications well seen on ultrasound and x-ray.  Reviewed images with patient and husband today, discussed possible management approaches including ShockWave therapy, barbotage, and surgery. Patient would like to check with her oncologist first, but she is leaning  towards ShockWave. She will schedule appointment today and let us  know if she needs to cancel/reschedule.

## 2024-04-07 ENCOUNTER — Ambulatory Visit (INDEPENDENT_AMBULATORY_CARE_PROVIDER_SITE_OTHER): Payer: Self-pay | Admitting: Family Medicine

## 2024-04-07 ENCOUNTER — Encounter: Payer: Self-pay | Admitting: Family Medicine

## 2024-04-07 DIAGNOSIS — M7531 Calcific tendinitis of right shoulder: Secondary | ICD-10-CM

## 2024-04-07 NOTE — Patient Instructions (Signed)

## 2024-04-07 NOTE — Progress Notes (Signed)
 FOLLOW-UP VISIT:  Shockwave Therapy Procedure   NAME:  Jennifer Kane DOB:  [birth year] MR#: [id number] DATE OF VISIT:  04/07/2024  Encounter:   [name] comes in for a follow up evaluation and 1st Radial Extracorporeal Shockwave therapy (ESWT) treatment to the  Rt shoulder for calcific tendinopathy.  Continues to have ongoing pain since last visit   ESWT Procedure Note: Treatment # 1  Diagnosis: Calcific tendinosis of the supraspinatus  Procedure Details  Consent for the procedure was obtained. Time out was performed. The anatomical landmarks and target areas for the procedure were identified.   PROCEDURE: ESWT was discussed with the patient in detail.  The risk and benefits were explained.  The point of maximal tenderness was identified and the oscillator probe was applied with moderate pressure. Treatment adjusted as mediated by patient feedback/pain.  Right lateral shoulder/supraspinatus was targeted for ESWT  Preset: Calcific tendinopathy Power level: 90 MJ Frequency: 10 hz Impulses: 2000 Head size: Medium/15 mm   Complications:  None; patient tolerated the procedure well.  Assessment/plan: 1. Calcific tendonitis of right shoulder  Plan:   ESWT completed today as noted above Return in 1 week for ESWT procedure #2 Procedure aftercare reviewed Recommended avoiding strenuous activities and using OTC analgesia prn.

## 2024-04-14 ENCOUNTER — Ambulatory Visit: Payer: Self-pay | Admitting: Family Medicine

## 2024-04-14 NOTE — Therapy (Incomplete)
 OUTPATIENT PHYSICAL THERAPY  UPPER EXTREMITY ONCOLOGY EVALUATION  Patient Name: Jennifer Kane MRN: [id number] DOB:Jun 21, 1975, 48 y.o., female Today's Date: 04/14/2024  END OF SESSION:   Past Medical History:  Diagnosis Date   Anxiety    Asthma    Breast cancer (HCC) 08/07/2022   Diabetes (HCC)    Dysrhythmia    Hx of Irregular Heartbeat   Family history of brain cancer    Family history of breast cancer    Family history of ovarian cancer    GERD (gastroesophageal reflux disease)    History of panic attacks    History of tachycardia    Details not clear, reported abnormal tilt table test at age 37, on chronic low-dose beta-blocker   PONV (postoperative nausea and vomiting)    Past Surgical History:  Procedure Laterality Date   BREAST BIOPSY Right 08/07/2022   US  RT BREAST BX W LOC DEV 1ST LESION IMG BX SPEC US  GUIDE 08/07/2022 [facility]   BREAST BIOPSY  09/04/2022   US  RT RADIOACTIVE SEED LOC 09/04/2022 [facility]   BREAST LUMPECTOMY WITH RADIOACTIVE SEED AND SENTINEL LYMPH NODE BIOPSY Right 09/06/2022   Procedure: RIGHT BREAST LUMPECTOMY WITH RADIOACTIVE SEED AND SENTINEL LYMPH NODE BIOPSY;  Surgeon: Vernetta Berg, MD;  Location: [facility];  Service: General;  Laterality: Right;   CHOLECYSTECTOMY N/A 05/01/2018   Procedure: LAPAROSCOPIC CHOLECYSTECTOMY;  Surgeon: Stevie Herlene Righter, MD;  Location: [facility];  Service: General;  Laterality: N/A;   KNEE ARTHROSCOPY  1989   No previous surgeries     WISDOM TOOTH EXTRACTION     Patient Active Problem List   Diagnosis Date Noted   Genetic testing 09/24/2022   Family history of breast cancer 09/12/2022   Family history of ovarian cancer 09/12/2022   Family history of brain cancer 09/12/2022   Malignant neoplasm of upper-inner quadrant of right breast in female, estrogen receptor positive (HCC) 08/24/2022   Moderate persistent asthma, uncomplicated 01/31/2022   Seasonal and perennial allergic rhinitis  01/31/2022   Anaphylactic shock due to adverse food reaction 01/31/2022    PCP:   REFERRING PROVIDER: Mackey Chad, MD  REFERRING DIAG: ***  THERAPY DIAG:  No diagnosis found.  ONSET DATE: ***  Rationale for Evaluation and Treatment: Rehabilitation  SUBJECTIVE:                                                                                                                                                                                           SUBJECTIVE STATEMENT:  Pt is having right shoulder pain and is going for calcium deposit shock therapy with concerns about lymphedema. If shock therapy  does not work she may require sx. MD wants Lymphedema evaluation before and during treatment  PERTINENT HISTORY:  Patient was diagnosed with right grade 2 IDC. It measures 1.1 cm. It is ER/PR positive with a Ki67 of 10%. Rt breast lumpectomy and SLNB on 09/06/22 with 7 negative nodes removed No benefit to chemotherapy and pt had radiation and antiestrogen therapy. Other hx includes asthma, DM, Right shoulder pain from cross fit   PAIN:  Are you having pain? {yes/no:20286} NPRS scale: ***/10 Pain location: *** Pain orientation: {Pain Orientation:25161}  PAIN TYPE: {type:313116} Pain description: {PAIN DESCRIPTION:21022940}  Aggravating factors: *** Relieving factors: ***  PRECAUTIONS: Right UE lymphedema risk,DM, Asthma, prior injury to right shoulder doing crossfit   RED FLAGS: None   WEIGHT BEARING RESTRICTIONS: No  FALLS:  Has patient fallen in last 6 months? {fallsyesno:27318}  LIVING ENVIRONMENT: Lives with: lives with their spouse Lives in: {Lives in:25570} Stairs: {yes/no:20286}; {Stairs:24000} Has following equipment at home: {Assistive devices:23999}  OCCUPATION:kindergarten teacher    LEISURE: ***  HAND DOMINANCE: right   PRIOR LEVEL OF FUNCTION: Independent  PATIENT GOALS: ***   OBJECTIVE: Note: Objective measures were completed at Evaluation unless  otherwise noted.  COGNITION: Overall cognitive status: Within functional limits for tasks assessed   PALPATION: ***  OBSERVATIONS / OTHER ASSESSMENTS: ***  SENSATION: Light touch: {intact/deficits:24005}  POSTURE: forward head, rounded shoulders  UPPER EXTREMITY AROM/PROM:  A/PROM RIGHT   eval   Shoulder extension   Shoulder flexion   Shoulder abduction   Shoulder internal rotation   Shoulder external rotation     (Blank rows = not tested)  A/PROM LEFT   eval  Shoulder extension   Shoulder flexion   Shoulder abduction   Shoulder internal rotation   Shoulder external rotation     (Blank rows = not tested)  CERVICAL AROM: All within fxl limits:      UPPER EXTREMITY STRENGTH:   LYMPHEDEMA ASSESSMENTS:   SURGERY TYPE/DATE: ***  NUMBER OF LYMPH NODES REMOVED: 0/7  CHEMOTHERAPY: NO  RADIATION:YES  HORMONE TREATMENT: ***  INFECTIONS: ***   LYMPHEDEMA ASSESSMENTS:   LANDMARK RIGHT  eval  At axilla    15 cm proximal to the proximal aspect of the olecranon process   10 cm proximal to the proximal aspect of the olecranon process   Olecranon process   15 cm proximal to the proximal aspect of the ulnar styloid process   10 cm proximal to the proximal aspect of the ulnar styloid process   Just distal to the ulnar styloid process   Across hand at thumb web space   At base of 2nd digit   (Blank rows = not tested)  LANDMARK LEFT  eval  At axilla    15 cm proximal to the proximal aspect of the olecranon process   10 cm proximal to the proximal aspect of the olecranon process   Olecranon process   15 cm proximal to the proximal aspect of the ulnar styloid process   10 cm proximal to  the proximal aspect of the ulnar styloid process   Just distal to the ulnar styloid process   Across hand at thumb web space   At base of 2nd digit   (Blank rows = not tested)  Chest circumference just inferior to the axillae:  Chest circumference at the largest point:      FUNCTIONAL TESTS:  {Functional tests:24029}   L-DEX LYMPHEDEMA SCREENING: The patient was assessed using the L-Dex machine today to produce a  lymphedema index baseline score. The patient will be reassessed on a regular basis (typically every 3 months) to obtain new L-Dex scores. If the score is > 6.5 points away from his/her baseline score indicating onset of subclinical lymphedema, it will be recommended to wear a compression garment for 4 weeks, 12 hours per day and then be reassessed. If the score continues to be > 6.5 points from baseline at reassessment, we will initiate lymphedema treatment. Assessing in this manner has a 95% rate of preventing clinically significant lymphedema.  QUICK DASH SURVEY: ***                                                                                                                            TREATMENT DATE: ***    PATIENT EDUCATION:  Education details: *** Person educated: {Person educated:25204} Education method: {Education Method:25205} Education comprehension: {Education Comprehension:25206}  HOME EXERCISE PROGRAM: ***  ASSESSMENT:  CLINICAL IMPRESSION: Patient is a 48 y.o. female who was seen today for physical therapy evaluation and treatment for lymphedema assessment prior to starting right shoulder shock treatment for calcium deposit..    OBJECTIVE IMPAIRMENTS: {opptimpairments:25111}.   ACTIVITY LIMITATIONS: {activitylimitations:27494}  PARTICIPATION LIMITATIONS: {participationrestrictions:25113}  PERSONAL FACTORS: {Personal factors:25162} are also affecting patient's functional outcome.   REHAB POTENTIAL: {rehabpotential:25112}  CLINICAL DECISION MAKING: {clinical decision making:25114}  EVALUATION COMPLEXITY: {Evaluation complexity:25115}  GOALS: Goals reviewed with patient? {yes/no:20286}  SHORT TERM GOALS: Target date: ***  *** Baseline: Goal status: INITIAL  2.  *** Baseline:  Goal status: INITIAL  3.   *** Baseline:  Goal status: INITIAL  4.  *** Baseline:  Goal status: INITIAL  5.  *** Baseline:  Goal status: INITIAL  6.  *** Baseline:  Goal status: INITIAL  LONG TERM GOALS: Target date: ***  *** Baseline:  Goal status: INITIAL  2.  *** Baseline:  Goal status: INITIAL  3.  *** Baseline:  Goal status: INITIAL  4.  *** Baseline:  Goal status: INITIAL  5.  *** Baseline:  Goal status: INITIAL  6.  *** Baseline:  Goal status: INITIAL  PLAN:  PT FREQUENCY: {rehab frequency:25116}  PT DURATION: {rehab duration:25117}  PLANNED INTERVENTIONS: {rehab planned interventions:25118::Patient/Family education,Balance training,Joint mobilization,Therapeutic exercises,Therapeutic activity,Neuromuscular re-education,Gait training,Self Care}  PLAN FOR NEXT SESSION: ***  Grayce JINNY Sheldon, PT 04/14/2024, 6:23 PM

## 2024-04-15 ENCOUNTER — Ambulatory Visit: Payer: Self-pay | Admitting: Internal Medicine

## 2024-04-15 ENCOUNTER — Encounter: Payer: Self-pay | Admitting: Internal Medicine

## 2024-04-15 ENCOUNTER — Ambulatory Visit

## 2024-04-15 DIAGNOSIS — M7531 Calcific tendinitis of right shoulder: Secondary | ICD-10-CM

## 2024-04-15 NOTE — Progress Notes (Cosign Needed)
 FOLLOW-UP VISIT:  Shockwave Therapy Procedure   NAME:  Jennifer Kane DOB:  07-03-1975 MR#: [id number] DATE OF VISIT:  04/15/2024  Encounter:   [name] comes in for a follow up evaluation and 2nd Radial Extracorporeal Shockwave therapy (ESWT) treatment to the right shoulder for calcific tendinopathy.  Tyia reports no interval change in symptoms.   ESWT Procedure Note: Treatment # 2  Diagnosis: Calcific tendinosis of the supraspinatus  Procedure Details  Consent for the procedure was obtained. Time out was performed. The anatomical landmarks and target areas for the procedure were identified.   PROCEDURE: ESWT was discussed with the patient in detail.  The risk and benefits were explained.  The point of maximal tenderness was identified and the oscillator probe was applied with moderate pressure. Treatment adjusted as mediated by patient feedback/pain.  Right lateral shoulder/supraspinatus was targeted for ESWT  Preset: Calcific tendinopathy Power level: 90 MJ Frequency: 10 hz Impulses: 2000 Head size: Medium/15 mm   Complications:  None; patient tolerated the procedure well.  Plan:   ESWT completed today as noted above Return in 1 week for ESWT procedure Procedure aftercare reviewed Recommended avoiding strenuous activities and using OTC analgesia prn.

## 2024-04-16 ENCOUNTER — Ambulatory Visit: Payer: Self-pay | Admitting: Family Medicine

## 2024-04-21 ENCOUNTER — Ambulatory Visit: Admitting: Family Medicine

## 2024-04-21 ENCOUNTER — Ambulatory Visit: Payer: Self-pay | Admitting: Family Medicine

## 2024-04-21 DIAGNOSIS — M7531 Calcific tendinitis of right shoulder: Secondary | ICD-10-CM

## 2024-04-21 NOTE — Progress Notes (Signed)
 FOLLOW-UP VISIT:  Shockwave Therapy Procedure   NAME:  Jennifer Kane DOB:  [birth year] MR#: [id number] DATE OF VISIT:  04/21/2024  Encounter:   Jennifer Kane comes in for a follow up evaluation and 3rd Radial Extracorporeal Shockwave therapy (ESWT) treatment to the  Rt shoulder.  Sugar reports some interval improvement in symptoms overall, some increased pain the past few days.  No new injury/trauma.  May have slept on it wrong   ESWT Procedure Note: Treatment #3  Diagnosis: Calcific tendinosis right supraspinatus  Procedure Details  Consent for the procedure was obtained. Time out was performed. The anatomical landmarks and target areas for the procedure were identified.   PROCEDURE: ESWT was discussed with the patient in detail.  The risk and benefits were explained.  The point of maximal tenderness was identified and the oscillator probe was applied with moderate pressure. Treatment adjusted as mediated by patient feedback/pain.  Rt lateral shoulder/supraspinatus was targeted for ESWT  Preset: calcific tendinopathy Power level: 120 MJ Frequency: 12 Hz Impulses: 2500 Head size: medium/78mm   Complications:  None; patient tolerated the procedure well.  ASSESSMENT/PLAN: 1. Calcific tendonitis of right shoulder  Plan:   ESWT completed today as noted above Return in 1 week for ESWT procedure Procedure aftercare reviewed Recommended avoiding strenuous activities and using OTC analgesia prn. Will plan for repeat MSK u/s 1-2 weeks after 6th session

## 2024-04-21 NOTE — Patient Instructions (Signed)

## 2024-04-23 ENCOUNTER — Ambulatory Visit: Admitting: Family Medicine

## 2024-04-28 ENCOUNTER — Ambulatory Visit: Admitting: Family Medicine

## 2024-04-29 ENCOUNTER — Encounter: Payer: Self-pay | Admitting: Internal Medicine

## 2024-04-29 ENCOUNTER — Ambulatory Visit: Payer: Self-pay | Admitting: Internal Medicine

## 2024-04-29 DIAGNOSIS — M7531 Calcific tendinitis of right shoulder: Secondary | ICD-10-CM

## 2024-04-29 NOTE — Progress Notes (Signed)
 FOLLOW-UP VISIT:  Shockwave Therapy Procedure   NAME:  Jennifer Kane DOB:  1975/06/03 MR#: [id number] DATE OF VISIT:  04/29/2024  Encounter:   Jennifer Kane comes in for a follow up evaluation and 4th Radial Extracorporeal Shockwave therapy (ESWT) treatment to the right shoulder.  Jennifer Kane reports minimal interval change in symptoms, certainly not worse.  ESWT Procedure Note: Treatment # 4  Diagnosis: Calcific tendinosis right supraspinatus  Procedure Details  Consent for the procedure was obtained. Time out was performed. The anatomical landmarks and target areas for the procedure were identified.   PROCEDURE: ESWT was discussed with the patient in detail.  The risk and benefits were explained.  The point of maximal tenderness was identified and the oscillator probe was applied with moderate pressure. Treatment adjusted as mediated by patient feedback/pain.  The right lateral shoulder/supraspinatus was targeted for ESWT  Preset: Calcific tendinopathy Power level: 120 MJ Frequency: 12 hz Impulses: 2500 Head size: Medium/15 mm   Complications:  None; patient tolerated the procedure well.  Calcific tendinitis of right shoulder  Plan:   ESWT completed today as noted above Return in 1 week for ESWT procedure Procedure aftercare reviewed Recommended avoiding strenuous activities and using OTC analgesia prn.

## 2024-04-30 ENCOUNTER — Ambulatory Visit: Admitting: Family Medicine

## 2024-05-04 ENCOUNTER — Ambulatory Visit: Payer: Self-pay | Admitting: Family Medicine

## 2024-05-04 ENCOUNTER — Encounter: Payer: Self-pay | Admitting: Family Medicine

## 2024-05-04 DIAGNOSIS — M7531 Calcific tendinitis of right shoulder: Secondary | ICD-10-CM

## 2024-05-04 NOTE — Patient Instructions (Signed)

## 2024-05-04 NOTE — Progress Notes (Signed)
 FOLLOW-UP VISIT:  Shockwave Therapy Procedure   NAME:  SUMIKO CEASAR DOB:  1975/12/17 MR#: [id number] DATE OF VISIT:  05/04/2024  Encounter:   [name] comes in for a follow up evaluation and 5th Radial Extracorporeal Shockwave therapy (ESWT) treatment to the  RT shoulder/supraspinatus calcification.  Israella reports some improvement overall, but still having pain.  After treatments will have several days where it is feeling better   ESWT Procedure Note: Treatment #5  Diagnosis: Calcific tendinosis right supraspinatus   Procedure Details  Consent for the procedure was obtained. Time out was performed. The anatomical landmarks and target areas for the procedure were identified.   PROCEDURE: ESWT was discussed with the patient in detail.  The risk and benefits were explained.  The point of maximal tenderness was identified and the oscillator probe was applied with moderate pressure. Treatment adjusted as mediated by patient feedback/pain.  Rt lateral shoulder/supraspinatus was targeted for ESWT   Preset: calcific tendinopathy  Power level: 120 MJ Frequency: 14 Hz Impulses: 2500 Head size:  medium/92mm    Complications:  None; patient tolerated the procedure well.  There are no diagnoses linked to this encounter. Plan:   ESWT completed today as noted above Return in 1 week for ESWT procedure #6 - will then plan for 1 week f/u after 6th treatment to reassess and re-examine under ultrasound to monitor progress. Procedure aftercare reviewed Recommended avoiding strenuous activities and using OTC analgesia prn.

## 2024-05-05 ENCOUNTER — Ambulatory Visit: Payer: Self-pay | Admitting: Family Medicine

## 2024-05-11 ENCOUNTER — Encounter: Payer: Self-pay | Admitting: Family Medicine

## 2024-05-11 ENCOUNTER — Ambulatory Visit (INDEPENDENT_AMBULATORY_CARE_PROVIDER_SITE_OTHER): Payer: Self-pay | Admitting: Family Medicine

## 2024-05-11 DIAGNOSIS — M7531 Calcific tendinitis of right shoulder: Secondary | ICD-10-CM

## 2024-05-11 NOTE — Progress Notes (Signed)
 FOLLOW-UP VISIT:  Shockwave Therapy Procedure   NAME:  Jennifer Kane DOB:  [birth year] MR#: [id number] DATE OF VISIT:  05/11/2024  Encounter:   Jennifer Kane comes in for a follow up evaluation and 6th Radial Extracorporeal Shockwave therapy (ESWT) treatment to the  Rt shoulder/supraspinatus calcification.  Jennifer Kane reports some improvement since last visit - sleeping more comfortably.      ESWT Procedure Note: Treatment #6  Diagnosis:  Calcific tendinosis right supraspinatus   Procedure Details  Consent for the procedure was obtained. Time out was performed. The anatomical landmarks and target areas for the procedure were identified.   PROCEDURE: ESWT was discussed with the patient in detail.  The risk and benefits were explained.  The point of maximal tenderness was identified and the oscillator probe was applied with moderate pressure. Treatment adjusted as mediated by patient feedback/pain.  Rt lateral shoulder/supraspinatus  was targeted for ESWT  Preset: calcific tendinopathy  Power level: 120 MJ Frequency: 16 Hz Impulses: 2500 Head size: medium/66mm   Complications:  None; patient tolerated the procedure well.   Plan:   ESWT completed today as noted above Return in 1-2 weeks for re-evaluation with MSK u/s to assess the calcification, then consider possible ongoing ESWT procedure Procedure aftercare reviewed Recommended avoiding strenuous activities and using OTC analgesia prn.

## 2024-05-11 NOTE — Patient Instructions (Signed)

## 2024-05-28 ENCOUNTER — Ambulatory Visit: Admitting: Family Medicine

## 2024-06-01 ENCOUNTER — Encounter: Payer: Self-pay | Admitting: Family Medicine

## 2024-06-01 ENCOUNTER — Other Ambulatory Visit: Payer: Self-pay

## 2024-06-01 ENCOUNTER — Ambulatory Visit: Admitting: Family Medicine

## 2024-06-01 VITALS — BP 114/86 | Ht 64.0 in | Wt 238.0 lb

## 2024-06-01 DIAGNOSIS — M7531 Calcific tendinitis of right shoulder: Secondary | ICD-10-CM | POA: Diagnosis not present

## 2024-06-01 DIAGNOSIS — M25511 Pain in right shoulder: Secondary | ICD-10-CM | POA: Diagnosis not present

## 2024-06-01 DIAGNOSIS — G8929 Other chronic pain: Secondary | ICD-10-CM | POA: Diagnosis not present

## 2024-06-01 NOTE — Progress Notes (Signed)
 DATE OF VISIT: 06/01/2024        Jennifer Kane DOB: Sep 17, 1975 MRN: [id number]  Discussed the use of AI scribe software for clinical note transcription with the patient, who gave verbal consent to proceed.  History of Present Illness Jennifer Kane is a 49 year old female with right shoulder calcific tendinitis who presents for follow-up after shockwave therapy.  Right Shoulder Pain and Function: - Completed six sessions of shockwave therapy for right shoulder calcific tendinitis - last was 05/01/24 - Significant improvement in range of motion and function over the past two weeks - Resolution of previous catching sensation - Substantial reduction in pain and mechanical symptoms - Estimates overall improvement at approximately 70% compared to baseline - Persistent pain with overhead movements and resisted abduction - Able to sleep well without shoulder discomfort    Medications:  Outpatient Encounter Medications as of 06/01/2024  Medication Sig   ALPRAZolam (XANAX) 0.25 MG tablet Take by mouth. (Patient not taking: Reported on 04/30/2023)   cetirizine (ZYRTEC) 5 MG chewable tablet Chew 5 mg by mouth daily. (Patient not taking: Reported on 04/03/2023)   EPINEPHrine  (EPIPEN  2-PAK) 0.3 mg/0.3 mL IJ SOAJ injection Inject 0.3 mg into the muscle once.   escitalopram (LEXAPRO) 10 MG tablet Take 5 mg by mouth at bedtime.   ibuprofen  (ADVIL ) 200 MG tablet Take 200 mg by mouth every 6 (six) hours as needed for moderate pain.   rosuvastatin (CRESTOR) 5 MG tablet Take 5 mg by mouth daily.   Semaglutide,0.25 or 0.5MG /DOS, (OZEMPIC, 0.25 OR 0.5 MG/DOSE,) 2 MG/1.5ML SOPN Inject 0.5 mg into the skin once a week. (Patient not taking: Reported on 11/04/2023)   trimethoprim (TRIMPEX) 100 MG tablet Take 100 mg by mouth daily.   trimethoprim (TRIMPEX) 100 MG tablet Take 100 mg by mouth 2 (two) times daily.   No facility-administered encounter medications on file as of 06/01/2024.    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  Physical Examination: Vitals: BP 114/86   Ht 5' 4 (1.626 m)   Wt 238 lb (108 kg)   BMI 40.85 kg/m  GENERAL:  Jennifer Kane is a 49 y.o. female appearing their stated age, alert and oriented x 3, in no apparent distress.  SKIN: no rashes or lesions, skin clean, dry, intact MSK: Right shoulder without any gross deformity.  No tenderness to palpation.  Near full range of motion with positive painful arc.  Positive empty can, mildly positive Neer, negative Hawkins.  Rotator cuff strength 5-/5 throughout.  Left shoulder with full range of motion without pain, weakness, instability Neurovascularly intact distally  Radiology: Limited MSK ultrasound right shoulder Date: 06/01/24 Indication: Follow-up right shoulder calcific tendinopathy  Findings: - Subscapularis with hypoechoic change along the insertional footplate.  Small calcification at the insertional footplate.  No definitive tears noted today - Subacromial space with small amount increased fluid in the subacromial bursa.  Calcifications again noted within the supraspinatus - Supraspinatus again noted to have large hyperechoic calcification near the insertional footplate.  Largest size approximately 9 mm.  Some associated acoustic shadowing precludes ability to completely evaluate supraspinatus in that area.  Calcification appears slightly less bulky, but is still quite prominent.   Impression: - Ongoing calcific tendinosis of supraspinatus.  Calcification still quite large measuring approximately 9 mm in size.  Appears slightly less bulky and possibly fragmented, but still quite prominent. - Mild subacromial bursitis Images and interpretation completed by Rainell Cedar, DO   Right shoulder x-ray 03/18/2024 showing:  FINDINGS: Bulky calcifications seen adjacent to the greater tuberosity consistent with calcific rotator cuff tendinosis. Mild degenerative changes of the acromioclavicular joint. Soft tissues  otherwise within normal limits.   IMPRESSION: Calcific rotator cuff tendinosis of the RIGHT shoulder.  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 Ultrasound showed slightly reduced and possibly fragmented calcific deposit, but it is still quite prominent. Condition improved but unresolved; further intervention may be needed. - Performed diagnostic ultrasound of the right shoulder today as noted above to monitor any changes from shockwave therapy. - Discussed additional shockwave therapy sessions versus surgical evaluation.  Since there is relatively minimal change in the size of the calcification, will refer to Dr. Bonner Hair with [facility] to further discuss possible surgical intervention. - Did discuss possibility of ultrasound-guided barbotage, but she is not interested - If not a candidate for surgery, could consider further shockwave therapy  Patient expressed understanding & agreement with above.  Encounter Diagnoses  Name Primary?   Chronic right shoulder pain Yes   Calcific tendonitis of right shoulder     Orders Placed This Encounter  Procedures   US  LIMITED JOINT SPACE STRUCTURES UP RIGHT     Contains text generated by [employer].

## 2024-06-01 NOTE — Patient Instructions (Signed)
 Dr Bonner Cristy [facility] [address] [place]  Thursday Jan. 22nd at 3p [phone]

## 2024-11-03 ENCOUNTER — Ambulatory Visit: Admitting: Hematology and Oncology
